# Patient Record
Sex: Male | Born: 1985
Health system: Southern US, Community
[De-identification: ages and names within clinical notes are randomized; demographics above are authoritative.]

## PROBLEM LIST (undated history)

## (undated) DIAGNOSIS — Z9889 Other specified postprocedural states: Secondary | ICD-10-CM

## (undated) DIAGNOSIS — Z8619 Personal history of other infectious and parasitic diseases: Secondary | ICD-10-CM

## (undated) DIAGNOSIS — R112 Nausea with vomiting, unspecified: Secondary | ICD-10-CM

## (undated) HISTORY — PX: SHOULDER SURGERY: SHX246

## (undated) HISTORY — PX: KNEE ARTHROSCOPY W/ ACL RECONSTRUCTION AND PATELLA GRAFT: SHX1861

## (undated) HISTORY — PX: ANKLE SURGERY: SHX546

## (undated) HISTORY — PX: BACK SURGERY: SHX140

## (undated) HISTORY — DX: Personal history of other infectious and parasitic diseases: Z86.19

---
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
## 2006-03-03 ENCOUNTER — Emergency Department (HOSPITAL_COMMUNITY): Admission: EM | Admit: 2006-03-03 | Discharge: 2006-03-03 | Payer: Self-pay | Admitting: Family Medicine

## 2006-11-12 ENCOUNTER — Emergency Department (HOSPITAL_COMMUNITY): Admission: EM | Admit: 2006-11-12 | Discharge: 2006-11-12 | Payer: Self-pay | Admitting: Emergency Medicine

## 2007-08-24 ENCOUNTER — Emergency Department (HOSPITAL_COMMUNITY): Admission: EM | Admit: 2007-08-24 | Discharge: 2007-08-24 | Payer: Self-pay | Admitting: Emergency Medicine

## 2007-08-31 ENCOUNTER — Ambulatory Visit (HOSPITAL_BASED_OUTPATIENT_CLINIC_OR_DEPARTMENT_OTHER): Admission: RE | Admit: 2007-08-31 | Discharge: 2007-08-31 | Payer: Self-pay | Admitting: Orthopedic Surgery

## 2008-01-05 ENCOUNTER — Emergency Department (HOSPITAL_COMMUNITY): Admission: EM | Admit: 2008-01-05 | Discharge: 2008-01-05 | Payer: Self-pay | Admitting: Family Medicine

## 2009-01-15 ENCOUNTER — Emergency Department (HOSPITAL_COMMUNITY): Admission: EM | Admit: 2009-01-15 | Discharge: 2009-01-15 | Payer: Self-pay | Admitting: Family Medicine

## 2009-05-23 ENCOUNTER — Emergency Department (HOSPITAL_COMMUNITY): Admission: EM | Admit: 2009-05-23 | Discharge: 2009-05-23 | Payer: Self-pay | Admitting: Family Medicine

## 2010-01-18 ENCOUNTER — Ambulatory Visit: Payer: Self-pay | Admitting: Internal Medicine

## 2010-07-07 ENCOUNTER — Emergency Department (HOSPITAL_COMMUNITY)
Admission: EM | Admit: 2010-07-07 | Discharge: 2010-07-07 | Payer: Self-pay | Source: Home / Self Care | Admitting: Emergency Medicine

## 2010-11-06 NOTE — Op Note (Signed)
[name], [name]            ACCOUNT NO.:  [id number]   MEDICAL RECORD NO.:  [id number]          PATIENT TYPE:  AMB   LOCATION:  DSC                          FACILITY:  [facility]   PHYSICIAN:  Eulas Post, MD    DATE OF BIRTH:  July 27, 1985   DATE OF PROCEDURE:  08/31/2007  DATE OF DISCHARGE:                               OPERATIVE REPORT   PREOPERATIVE DIAGNOSIS:  Left distal fibula / ankle fracture.   POSTOPERATIVE DIAGNOSIS:  Left distal fibula / ankle fracture.   OPERATIVE PROCEDURE:  Open reduction and internal fixation of left  distal fibula ankle fracture.   ANESTHESIA:  General.   TOURNIQUET TIME:  79 minutes.   ESTIMATED BLOOD LOSS:  Minimal.   ANTIBIOTICS:  1 g intravenous Ancef given preoperatively.   OPERATIVE IMPLANTS:  Synthes one-third tubular plate with a size 6 hole,  with a total of three cortical screws proximally and two cancellous  screws distally with a single lag screw across the fracture site.   PREOPERATIVE INDICATIONS:  Mr. [name] is a 25 year old young  man who was sledding when he fell and twisted his ankle.  He had a  fractured ankle that was unstable.  He elected to undergo the above-  named procedure.  The risks, benefits and alternatives to operative  intervention were discussed with him preoperatively including but not  limited to the risks of infection, bleeding, nerve injury, malunion,  nonunion, ankle stiffness, progression of arthritis, cardiopulmonary  complications, among others, and hardware prominence and hardware  failure and the need for hardware removal, and the patient was willing  to proceed.   OPERATIVE FINDINGS:  There was a very low fibula fracture with an  unstable mortise.  The fracture was well below the tibiotalar joint.  We  were able to get two solid cancellous screws distally and an excellent  lag screw.   OPERATIVE PROCEDURE:  The patient brought to the operating room and  placed in supine position.   General anesthesia was administered.  1 g  intravenous Ancef was given.  The left lower extremity was prepped and  draped in the usual sterile fashion.  Incision was made over the left  fibula.  The fibula was exposed and the fracture identified and reduced  and held with a clamp.  We placed the lag screw.  We then contoured the  plate appropriately and placed the plate as distal as possible in order  to achieve fixation for a very distal fracture.  Excellent fixation was  achieved.  Confirmation of the hardware was made on the AP and lateral  views with the fluoroscopy.  The wounds were irrigated copiously and the  deep tissue closed with 2-0 followed by 3-0 Vicryl and then a Monocryl  for the skin.  Steri-Strips were placed.  Sterile gauze and a posterior splint were  applied.  The patient was awakened and returned the PACU in stable and  satisfactory condition.  There were no complications.  The patient  tolerated the procedure well.      Eulas Post, MD  Electronically Signed  JPL/MEDQ  D:  08/31/2007  T:  09/01/2007  Job:  [id number]

## 2010-11-09 NOTE — Op Note (Signed)
NAME:  [name], [name]                      ACCOUNT NO.:  [id number]   MEDICAL RECORD NO.:  [id number]                   PATIENT TYPE:  OIB   LOCATION:  3033                                 FACILITY:  [facility]   PHYSICIAN:  Sharolyn Douglas, M.D.                     DATE OF BIRTH:  February 25, 1986   DATE OF PROCEDURE:  02/02/2004  DATE OF DISCHARGE:                                 OPERATIVE REPORT   PREOPERATIVE DIAGNOSIS:  L5-S1 isthmic spondylolisthesis.   OPERATION/PROCEDURE:  1. L5-S1 Gill laminectomy with bilateral foraminotomies.  2. Posterior spinal arthrodesis, L5-S1.  3. Pedicle screw instrumentation L5-S1 using the [employer] System.  4. Right posterior iliac crest bone graft supplement with local autogenous     bone graft.  5. Neuro monitoring with frequent EMGs x3 __________  testing of four     pedicle screws.   SURGEON:  Sharolyn Douglas, M.D.   ASSISTANT:  Jill Side Mahar, P.A.C.   ANESTHESIA:  General endotracheal anesthesia.   INDICATIONS:  The patient is a very pleasant 25 year old male with chronic  disabling back pain thought to be secondary to a grade 1/2 isthmic  spondylolisthesis.  He has been refractory to conservative treatment options  and has elected to undergo fusion in hopes of improving his symptoms.  Risks, benefits and alternatives were reviewed.   DESCRIPTION OF PROCEDURE:  The patient was brought to and identified in the  holding area, and taken to the operating room.  Underwent general  endotracheal anesthesia without difficulty, given prophylactic IV  antibiotics, carefully turned prone on to the Wilson frame.  All bony  prominences were padded __________  at all times.  Back prepped and draped in the usual sterile fashion.   Incision was made in the midline, L4 down to S1.  Dissection was carried  sharply through the deep fascia.  The L5 spinous process and lamina could be  easily identified with motion at the pars defect.  The transverse processes  of  L5 as well as the sacral ala were exposed bilaterally.  Deep retractors  were placed.  The Gill fragment was removed in one complete piece using  Leksell rongeurs and curets.  The bone was cleaned of all soft tissue and  morselized for later auto graft.  We then completed bilaterally  foraminotomies using Kerrison punches.  We had a good decompression of the  L5 nerve roots bilaterally.  We then turned our attention to placing pedicle  screws at L5-S1 bilaterally using anatomic probing technique and  fluoroscopy.  We placed 6.5 x 50 mm screws at L5 and 7.5 x 40 mm screws in  the sacrum.  We had good screw purchase.  After placing each screw,  triggered EMGs were utilized and there were no dilatory changes.  Free  running EMGs were monitored throughout the procedure and there were no  changes.   We then turned our attention to  completing the posterior spinal arthrodesis.  High-speed bur used to decorticate the transverse process of L5 in the  sacral ala.   We then turned our attention to harvesting right posterior iliac crest bone  graft.  This again was elevate out over the PSIS.  Through a separate  fascial incision, the iliac crest was exposed.  Leksell rongeur used to  remove the PSIS.  Curets were used to remove copious amounts of cancellous  bone graft from between the inner tables of the crest.  The wound was  irrigated.  Gelfoam left in the defect.  Fascia closed with interrupted #1  Vicryl suture.   We then returned out attention to the posterior spinal arthrodesis.  The  iliac crest bone graft was packed tightly over the lateral gutters.  The  local bone graft from the Gill fragment when packed over top of the  autograft.  Short segment titanium rods were placed and gentle compression  applied.  We achieved a partial reduction of the deformity when the screws  were tightened.  The locking caps were sheared off.  Final AP and laterals  images saved.  Deep Hemovac drain left in  place.  Deep fascia closed with a  running #1 Vicryl suture, subcutaneous layer closed with 2-0 Vicryl followed  by a running 3-0 subcuticular Vicryl suture, Benzoin and Steri-Strips.  Sterile dressing applied.  The patient was turned supine and extubated  without difficulty and transferred to recovery room in stable condition,  able to move his upper and lower extremities.                                               Sharolyn Douglas, M.D.    MC/MEDQ  D:  02/02/2004  T:  02/03/2004  Job:  [id number]

## 2010-11-09 NOTE — Discharge Summary (Signed)
[name], [name] NO.:  [id number]   MEDICAL RECORD NO.:  [id number]          PATIENT TYPE:  INP   LOCATION:  3033                         FACILITY:  [facility]   PHYSICIAN:  Sharolyn Douglas, M.D.        DATE OF BIRTH:  Feb 10, 1986   DATE OF ADMISSION:  02/02/2004  DATE OF DISCHARGE:  02/05/2004                                 DISCHARGE SUMMARY   ADMISSION DIAGNOSIS:  Spondylolisthesis lumbar verterbrae-5/sacral vertebrae-  1.   DISCHARGE DIAGNOSES:  1.  Status posterior spinal fusion lumbar verterbrae-5/sacral vertebrae-1      and Gill laminectomy.  2.  Hyperglycemia postoperatively.   PROCEDURE:  February 02, 2004, the patient was taken to the operating room for  L5-S1 posterior spinal fusion with pedicle screws and Gill laminectomies by  Dr. Sharolyn Douglas and assistant Verlin Fester, P.A.C.   ANESTHESIA:  General.   CONSULTATIONS:  None.   LABORATORY DATA:  Preoperative CBC with differential was within normal  limits.  PT, INR, and PTT within normal limits.  Complete metabolic panel  within normal limits.  UA within normal limits.  Postoperatively, H&H was  monitored and remained normal.  BMET was monitored one day and he did have  glucose elevated at 120.  Blood type from January 31, 2004 showed B positive,  antibody screen negative.   BRIEF HISTORY:  The patient is an 25 year old male with severe back pain  that is limiting his activities and his quality of life.  He has been seen  by Dr. Sharolyn Douglas and has tried numerous types of conservative management  including time, rest, activity, modification, physical therapy, medications.  Unfortunately, the pain continues to interfere with his quality of life and  was getting progressively worse despite treatment secondary to failure to  improve with conservative management as well as spondylolisthesis at L5/S1.  It was thought that his only course of management or hope to improve his  symptoms would be a posterior spinal  fusion as well as a Gill laminectomy.   The risks and benefits of this procedure were discussed at length with the  patient as well as his mother by Dr. Sharolyn Douglas and myself.  They indicated  understanding and wished to proceed.   HOSPITAL COURSE:  On February 02, 2004, the patient was taken to the operating  room for the above-listed procedure.  He tolerated the procedure well  without any intraoperative complication.  There was one Hemovac drain placed  intraoperatively.  He was transferred to the recovery room in stable  condition.   Postoperatively, routine orthopedic spine protocol was followed including  prophylactic antibiotics, early mobilization.  Diet was held and NPO until  he passed flatus.  He progressed well.  He did have family member get him  some food the night of surgery and  the following morning he was feeling a  bit uncomfortable, nauseated, and vomiting.  We reinforced to him that we  are keeping his diet at limited for his own well being and safety.  He did  stop eating on his own and advanced his diet after he  was told he could.   By February 04, 2004, all of his nausea and vomiting had resolved.  We slowly  advanced his diet to a regular diet.  He worked out daily with physical  therapy and made excellent progress with them.  By February 05, 2004, the  patient was eating a regular diet, tolerating it well, and passing flatus.  Medically, he was stable and ready for discharge.  Orthopedically, he had  met all goals.  He was doing extremely well.   PLAN:  The patient is an 25 year old male who is status post L5/S1 posterior  spinal fusion and Gill laminectomy doing well.   ACTIVITY:  Daily ambulation, brace on when he is up.  Back precautions.  No  lifting greater than five pounds.  Dressing changes daily.  His incision dry  x 5 days.   FOLLOW UP:  Follow up in two weeks postoperatively by Dr. Sharolyn Douglas.   DIET:  Regular home diet.   CONDITION ON DISCHARGE:   Stable and improved.   DISCHARGE MEDICATIONS:  1.  Percocet for pain.  2.  Robaxin for muscle.  3.  Multivitamin q.d.  4.  Laxative q.d.  5.  Calcium q.d.  6.  Avoid NSAIDs x 3 months.   CONDITION ON DISCHARGE:  Stable and improve.   DISPOSITION:  The patient is being discharged to his home with his family  assistance.       CM/MEDQ  D:  03/14/2004  T:  03/14/2004  Job:  [id number]

## 2011-02-22 ENCOUNTER — Ambulatory Visit (HOSPITAL_BASED_OUTPATIENT_CLINIC_OR_DEPARTMENT_OTHER)
Admission: RE | Admit: 2011-02-22 | Discharge: 2011-02-22 | Disposition: A | Discharge status: Home / Self Care | Payer: BC Managed Care – PPO | Source: Ambulatory Visit | Attending: Orthopedic Surgery | Admitting: Orthopedic Surgery

## 2011-02-22 DIAGNOSIS — S83289A Other tear of lateral meniscus, current injury, unspecified knee, initial encounter: Secondary | ICD-10-CM | POA: Insufficient documentation

## 2011-02-22 DIAGNOSIS — S83509A Sprain of unspecified cruciate ligament of unspecified knee, initial encounter: Secondary | ICD-10-CM | POA: Insufficient documentation

## 2011-02-22 DIAGNOSIS — Z01812 Encounter for preprocedural laboratory examination: Secondary | ICD-10-CM | POA: Insufficient documentation

## 2011-02-22 DIAGNOSIS — X58XXXA Exposure to other specified factors, initial encounter: Secondary | ICD-10-CM | POA: Insufficient documentation

## 2011-02-22 DIAGNOSIS — F17200 Nicotine dependence, unspecified, uncomplicated: Secondary | ICD-10-CM | POA: Insufficient documentation

## 2011-02-24 IMAGING — CR DG WRIST COMPLETE 3+V*R*
2 series · 2 of 2 positions shown · non-contrast
Comparison: None

CLINICAL DATA: Wrist pain after splitting Proano

RIGHT WRIST - COMPLETE 3+ VIEW

[view not recorded (1 of 2)]
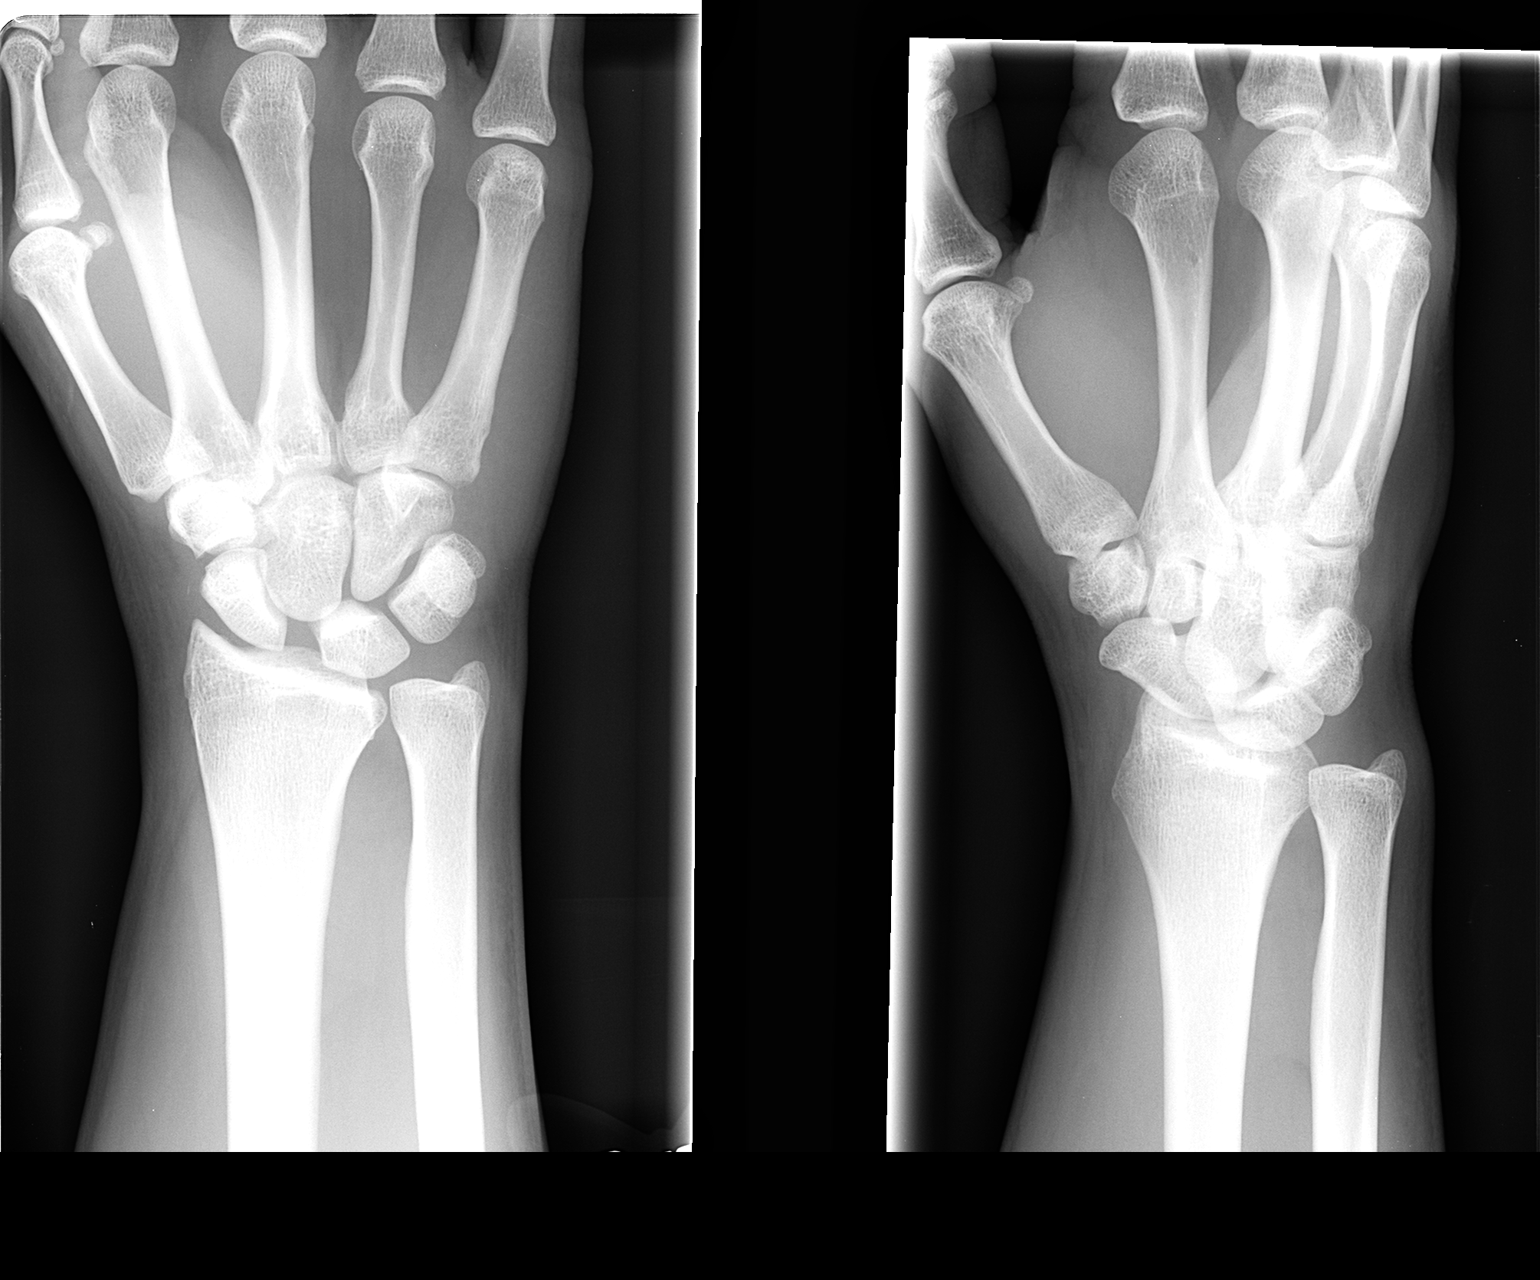

[view not recorded (2 of 2)]
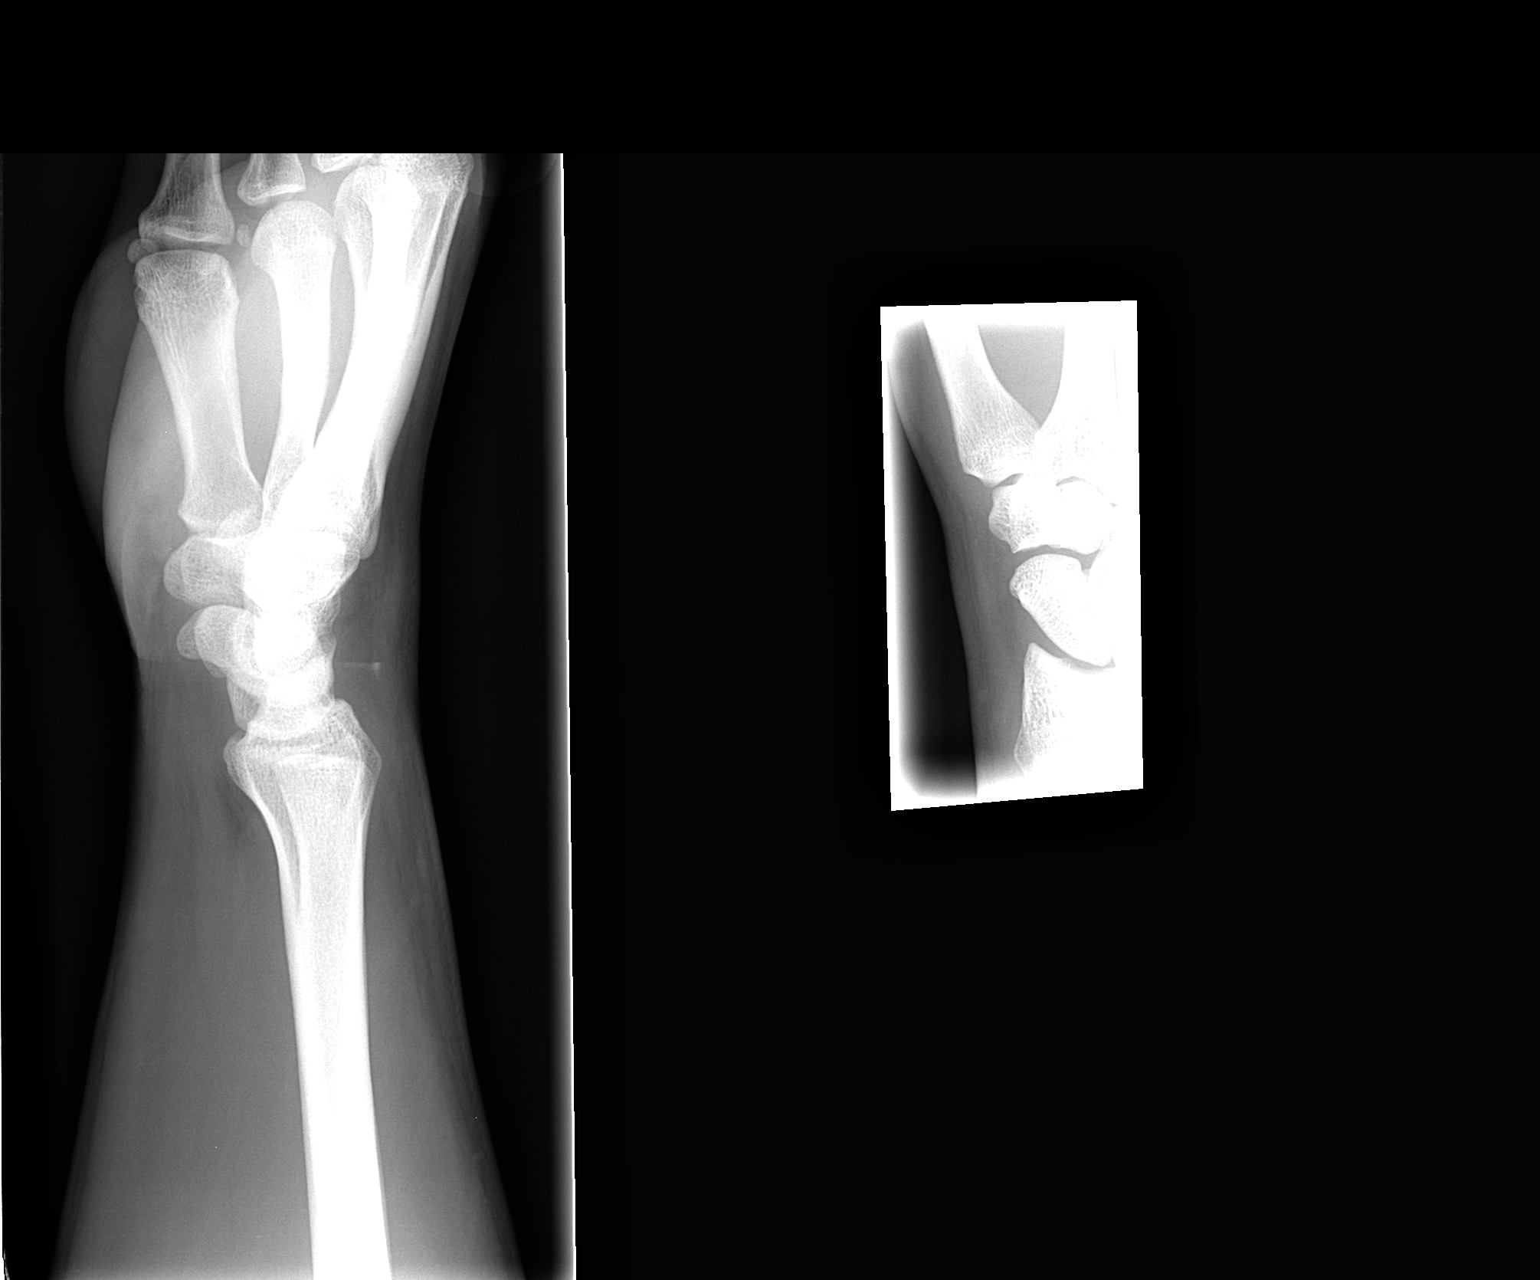

[2 of 2 positions shown; findings below may reference images not displayed]

FINDINGS: Normal alignment and no fracture.  No significant
degenerative change.

Faint density dorsal to the wrist joint is probably artifact on the
lateral view.
IMPRESSION: Negative for fracture.

## 2011-03-01 NOTE — Op Note (Signed)
NAMEMarland Murillo  BREVYN, RING NO.:  [id number]  MEDICAL RECORD NO.:  [id number]  LOCATION:                                 FACILITY:  PHYSICIAN:  Eulas Post, MD         DATE OF BIRTH:  DATE OF PROCEDURE:  02/22/2011 DATE OF DISCHARGE:                              OPERATIVE REPORT   ATTENDING SURGEON:  Eulas Post, MD  FIRST ASSISTANT:  Janace Litten, orthopedic PA-C  PREOPERATIVE DIAGNOSIS:  Right acromioclavicular tear, possible medial meniscus tear, possible lateral meniscus tear.  POSTOPERATIVE DIAGNOSIS:  Right acromioclavicular tear and posterior horn lateral meniscus tear.  OPERATIVE PROCEDURE:  Right acromioclavicular reconstruction and partial lateral meniscectomy.  ANESTHESIA:  General.  ESTIMATED BLOOD LOSS:  Minimal.  TOURNIQUET TIME:  Two hours.  OPERATIVE IMPLANTS:  Hamstring autograft and also an Arthrex ACL TightRope button with a size 9 x 32-mm BioComposite interference screw for the tibia and a 4.5-mm PushLock for backup on the tibia.  PREOPERATIVE INDICATIONS:  Mr. [name] is a 25 year old young man who tore his right ACL and had an injury to his meniscus.  He elected for surgical management.  The risks, benefits, and alternatives were discussed before the procedure including but not limited to risks of infection, bleeding, nerve injury, recurrent ACL rupture, stiffness, loss of function, cardiopulmonary complications, among others and he is willing to proceed.  OPERATIVE FINDINGS:  The ACL was completely torn and had a positive pivot and a positive Lachman.  His posterior drawer was intact.  He had a negative dial test.  He had some grade 1 instability to MCL testing. His posterior horn medial meniscus was intact with no instability.  The lateral meniscus had a flap tear of the posterior horn that was flipped into the back of the intercondylar notch.  The meniscus itself was in good condition around the body and  anterior horn, although the attachment to the posterior horn was damaged, although not completely unstable.  There were still intact fibers that maintained the integrity of the meniscus, although probably half of its insertion into the posterior horn had been disrupted.  The PCL was intact and the chondral surfaces of the patellofemoral joint, as well as the medial and lateral compartments were intact.  OPERATIVE PROCEDURE:  The patient was brought to the operating room and placed in supine position.  IV antibiotics were given.  General anesthesia was administered.  The right lower extremity was prepped and draped in the usual sterile fashion.  Time-out was performed.  He had a positive pivot and Lachman's and so I harvested his hamstring tendons first.  An incision was made over the insertion of the hamstring tendons, and dissection was carried down.  The sartorius was reflected and then semitendinosus and gracilis were identified.  All soft tissue attachments were released and then I harvested the hamstrings.  This was an excellent harvest, and his hamstrings were remarkably large. Ultimately, the graft measured 8.5 mm in diameter with a doubled over, and we actually left the length that longer than usual, 210 mm, in order to accommodate for his fairly tall stature.  I then performed a diagnostic arthroscopy while Apolinar Junes  Juan Quam, orthopedic PA-C, prepared the graft.  The meniscal tear on the lateral side was identified and then shaved and removed with the arthroscopic shaver.  This was trimmed down to a stable rim and the flap portion was completely excised.  I then performed a moderate notchplasty and excised the remnant ACL.  I then drilled my femoral tunnel using the 8.5-mm retro reamer from outside-in into the appropriate position on the wall.  Excellent cortical rim was achieved.  I then passed my suture and then drilled my tibial tunnel.  This was drilled with the 8.5-mm  retro reamer with a guide set at 55 degrees.  I then opened the tibia at the cortex with an 8.0-mm acorn reamer and then dilated up from an 8 to an 8.5.  I cleaned all bone debris out of the knee and then completed the passage of my passing sutures through the tibia.  The graft had been tensioned and was ready and I passed the graft first by flipping the button under direct visualization looking from the medial portal up into the notch, and I confirmed the button flipped with C-arm.  I then delivered the graft and delivered approximately 25 mm of graft into the femoral tunnel and then cycled the knee.  It had excellent isometry.  There was a little bit more tightness in full extension, but the overall isometry was excellent.  Remarkably, despite the fact that we left the graft at a length of 210 mm, it was pulled all the way within the tibial tunnel, and I measured the depth which was about 10-mm inside the tibial tunnel.  The overall tibial tunnel length was almost 50 mm.  Therefore, I selected the longer screw and placed a guidewire up the tibial tunnel, tensioned the graft, applied a reverse Lachman, and then placed the interference screw. Excellent interference fit was achieved and the graft was stabilized and the Lachman restored to normal translation.  There was no graft impingement.  Given the fact that the hamstring graft, however, did not exit out of the tibial tunnel, I did back this up with a PushLock.  All 4 strands were passed through a 4.5-mm PushLock and then buried in the bone with appropriate tension.  The knee had full motion with no undue tension on the graft and had excellent stability.  The wounds were irrigated copiously and the sartorius repaired followed by subcutaneous Vicryl and then Monocryl for the portals and incision.  The sutures were cut proximally as well.  The patient was awakened and the tourniquet was released and he returned to PACU in stable and  satisfactory condition. Sterile gauze was also applied and he was also injected.  He tolerated this well.  There were no complications.  Janace Litten, orthopedic PA-C, was present and scrubbed throughout the case and critical for assistance with exposure as well as graft preparation, instrumentation, tensioning, and closure.    Eulas Post, MD    JPL/MEDQ  D:  02/22/2011  T:  02/22/2011  Job:  [id number]  Electronically Signed by Teryl Lucy MD on 03/01/2011 04:32:46 PM

## 2011-03-18 LAB — POCT HEMOGLOBIN-HEMACUE: Hemoglobin: 15.8

## 2011-08-15 ENCOUNTER — Encounter (HOSPITAL_COMMUNITY): Payer: Self-pay | Admitting: *Deleted

## 2011-08-15 ENCOUNTER — Emergency Department (HOSPITAL_COMMUNITY)
Admission: EM | Admit: 2011-08-15 | Discharge: 2011-08-16 | Disposition: A | Discharge status: Home / Self Care | Payer: 59 | Attending: Emergency Medicine | Admitting: Emergency Medicine

## 2011-08-15 ENCOUNTER — Other Ambulatory Visit: Payer: Self-pay

## 2011-08-15 DIAGNOSIS — R079 Chest pain, unspecified: Secondary | ICD-10-CM | POA: Insufficient documentation

## 2011-08-15 DIAGNOSIS — I498 Other specified cardiac arrhythmias: Secondary | ICD-10-CM | POA: Insufficient documentation

## 2011-08-15 DIAGNOSIS — K219 Gastro-esophageal reflux disease without esophagitis: Secondary | ICD-10-CM | POA: Insufficient documentation

## 2011-08-15 DIAGNOSIS — F17200 Nicotine dependence, unspecified, uncomplicated: Secondary | ICD-10-CM | POA: Insufficient documentation

## 2011-08-15 LAB — CBC
HCT: 43.2 % (ref 39.0–52.0)
Hemoglobin: 15.9 g/dL (ref 13.0–17.0)
Platelets: 176 10*3/uL (ref 150–400)
RBC: 4.79 MIL/uL (ref 4.22–5.81)
WBC: 6.6 10*3/uL (ref 4.0–10.5)

## 2011-08-15 LAB — PRO B NATRIURETIC PEPTIDE: Pro B Natriuretic peptide (BNP): 9.1 pg/mL (ref 0–125)

## 2011-08-15 LAB — TROPONIN I: Troponin I: <0.3 ng/mL (ref <0.30)

## 2011-08-15 LAB — BASIC METABOLIC PANEL
BUN: 19 mg/dL (ref 6–23)
Potassium: 3.9 mEq/L (ref 3.5–5.1)

## 2011-08-15 MED ORDER — NITROGLYCERIN 0.4 MG SL SUBL
0.4000 mg | SUBLINGUAL_TABLET | SUBLINGUAL | Status: DC | PRN
  Administered 2011-08-15 (×2): 0.4 mg via SUBLINGUAL
  Filled 2011-08-15: qty 75

## 2011-08-15 MED ORDER — ACETAMINOPHEN 500 MG PO TABS
1000.0000 mg | ORAL_TABLET | ORAL | Status: AC
  Administered 2011-08-15: 1000 mg via ORAL
  Filled 2011-08-15: qty 2

## 2011-08-15 MED ORDER — ASPIRIN 325 MG PO TABS
325.0000 mg | ORAL_TABLET | ORAL | Status: AC
  Administered 2011-08-15: 325 mg via ORAL
  Filled 2011-08-15: qty 1

## 2011-08-15 NOTE — ED Notes (Signed)
MD at bedside. Idol PA with patient

## 2011-08-15 NOTE — ED Notes (Signed)
Pt has been having CP with heartburn and sob and headache and jaw pain for a couple of months, it was worse today.  Pt states that the pain increases with activity

## 2011-08-16 NOTE — ED Provider Notes (Signed)
Medical screening examination/treatment/procedure(s) were performed by non-physician practitioner and as supervising physician I was immediately available for consultation/collaboration.  Flint Melter, MD 08/16/11 [phone]

## 2011-08-16 NOTE — ED Provider Notes (Signed)
Patient with chronic symptoms of GERD. He is stable in the ED department. Evaluation negative for cardiac or pulmonary abnormalities.   Have recommended over-the-counter PPI and antacids when necessary.  Medical screening examination/treatment/procedure(s) were conducted as a shared visit with non-physician practitioner(s) and myself.  I personally evaluated the patient during the encounter  Flint Melter, MD 08/16/11 [phone]

## 2011-08-16 NOTE — Discharge Instructions (Signed)
Gastroesophageal Reflux Disease, Adult Gastroesophageal reflux disease (GERD) happens when acid from your stomach flows up into the esophagus. When acid comes in contact with the esophagus, the acid causes soreness (inflammation) in the esophagus. Over time, GERD may create small holes (ulcers) in the lining of the esophagus. CAUSES   Increased body weight. This puts pressure on the stomach, making acid rise from the stomach into the esophagus.   Smoking. This increases acid production in the stomach.   Drinking alcohol. This causes decreased pressure in the lower esophageal sphincter (valve or ring of muscle between the esophagus and stomach), allowing acid from the stomach into the esophagus.   Late evening meals and a full stomach. This increases pressure and acid production in the stomach.   A malformed lower esophageal sphincter.  Sometimes, no cause is found. SYMPTOMS   Burning pain in the lower part of the mid-chest behind the breastbone and in the mid-stomach area. This may occur twice a week or more often.   Trouble swallowing.   Sore throat.   Dry cough.   Asthma-like symptoms including chest tightness, shortness of breath, or wheezing.  DIAGNOSIS  Your caregiver may be able to diagnose GERD based on your symptoms. In some cases, X-rays and other tests may be done to check for complications or to check the condition of your stomach and esophagus. TREATMENT  Your caregiver may recommend over-the-counter or prescription medicines to help decrease acid production. Ask your caregiver before starting or adding any new medicines.  HOME CARE INSTRUCTIONS   Change the factors that you can control. Ask your caregiver for guidance concerning weight loss, quitting smoking, and alcohol consumption.   Avoid foods and drinks that make your symptoms worse, such as:   Caffeine or alcoholic drinks.   Chocolate.   Peppermint or mint flavorings.   Garlic and onions.   Spicy foods.     Citrus fruits, such as oranges, lemons, or limes.   Tomato-based foods such as sauce, chili, salsa, and pizza.   Fried and fatty foods.   Avoid lying down for the 3 hours prior to your bedtime or prior to taking a nap.   Eat small, frequent meals instead of large meals.   Wear loose-fitting clothing. Do not wear anything tight around your waist that causes pressure on your stomach.   Raise the head of your bed 6 to 8 inches with wood blocks to help you sleep. Extra pillows will not help.   Only take over-the-counter or prescription medicines for pain, discomfort, or fever as directed by your caregiver.   Do not take aspirin, ibuprofen, or other nonsteroidal anti-inflammatory drugs (NSAIDs).  SEEK IMMEDIATE MEDICAL CARE IF:   You have pain in your arms, neck, jaw, teeth, or back.   Your pain increases or changes in intensity or duration.   You develop nausea, vomiting, or sweating (diaphoresis).   You develop shortness of breath, or you faint.   Your vomit is green, yellow, black, or looks like coffee grounds or blood.   Your stool is red, bloody, or black.  These symptoms could be signs of other problems, such as heart disease, gastric bleeding, or esophageal bleeding. MAKE SURE YOU:   Understand these instructions.   Will watch your condition.   Will get help right away if you are not doing well or get worse.  Document Released: 03/20/2005 Document Revised: 02/20/2011 Document Reviewed: 12/28/2010 [facility] Patient Information 2012 [place], [place].Gastroesophageal Reflux Disease, Adult Gastroesophageal reflux disease (GERD) happens when  acid from your stomach flows up into the esophagus. When acid comes in contact with the esophagus, the acid causes soreness (inflammation) in the esophagus. Over time, GERD may create small holes (ulcers) in the lining of the esophagus. CAUSES   Increased body weight. This puts pressure on the stomach, making acid rise from the stomach  into the esophagus.   Smoking. This increases acid production in the stomach.   Drinking alcohol. This causes decreased pressure in the lower esophageal sphincter (valve or ring of muscle between the esophagus and stomach), allowing acid from the stomach into the esophagus.   Late evening meals and a full stomach. This increases pressure and acid production in the stomach.   A malformed lower esophageal sphincter.  Sometimes, no cause is found. SYMPTOMS   Burning pain in the lower part of the mid-chest behind the breastbone and in the mid-stomach area. This may occur twice a week or more often.   Trouble swallowing.   Sore throat.   Dry cough.   Asthma-like symptoms including chest tightness, shortness of breath, or wheezing.  DIAGNOSIS  Your caregiver may be able to diagnose GERD based on your symptoms. In some cases, X-rays and other tests may be done to check for complications or to check the condition of your stomach and esophagus. TREATMENT  Your caregiver may recommend over-the-counter or prescription medicines to help decrease acid production. Ask your caregiver before starting or adding any new medicines.  HOME CARE INSTRUCTIONS   Change the factors that you can control. Ask your caregiver for guidance concerning weight loss, quitting smoking, and alcohol consumption.   Avoid foods and drinks that make your symptoms worse, such as:   Caffeine or alcoholic drinks.   Chocolate.   Peppermint or mint flavorings.   Garlic and onions.   Spicy foods.   Citrus fruits, such as oranges, lemons, or limes.   Tomato-based foods such as sauce, chili, salsa, and pizza.   Fried and fatty foods.   Avoid lying down for the 3 hours prior to your bedtime or prior to taking a nap.   Eat small, frequent meals instead of large meals.   Wear loose-fitting clothing. Do not wear anything tight around your waist that causes pressure on your stomach.   Raise the head of your bed 6 to  8 inches with wood blocks to help you sleep. Extra pillows will not help.   Only take over-the-counter or prescription medicines for pain, discomfort, or fever as directed by your caregiver.   Do not take aspirin, ibuprofen, or other nonsteroidal anti-inflammatory drugs (NSAIDs).  SEEK IMMEDIATE MEDICAL CARE IF:   You have pain in your arms, neck, jaw, teeth, or back.   Your pain increases or changes in intensity or duration.   You develop nausea, vomiting, or sweating (diaphoresis).   You develop shortness of breath, or you faint.   Your vomit is green, yellow, black, or looks like coffee grounds or blood.   Your stool is red, bloody, or black.  These symptoms could be signs of other problems, such as heart disease, gastric bleeding, or esophageal bleeding. MAKE SURE YOU:   Understand these instructions.   Will watch your condition.   Will get help right away if you are not doing well or get worse.  Document Released: 03/20/2005 Document Revised: 02/20/2011 Document Reviewed: 12/28/2010 [facility] Patient Information 2012 [place], [place].     You may try Prilosec (no prescription needed) for better control of your acid reflux.  You may also try medicines such as maalox or mylanta.  Your labs and ekg are normal tonight - there is no sign of any heart involvement with your symptoms.    RESOURCE GUIDE  Dental Problems  Patients with Medicaid: [facility] [id number]                                           [phone] Phone:  [phone]                                                  Phone:  [phone]  If unable to pay or uninsured, contact:  [facility] or [facility]. to become qualified for the adult dental clinic.  Chronic Pain Problems Contact [facility]  [phone] Patients need to be referred by their primary care doctor.  Insufficient Money for  Medicine Contact United Way:  call "211" or [facility] 403-723-2616.  No Primary Care Doctor Call [facility]  [phone] Other agencies that provide inexpensive medical care    [facility]  [phone]    [facility]  [phone]    [facility]  [phone]    [facility]  [phone]    [facility]anned [facility]  8175770532    [facility]  [phone]  Psychological Services [facility]  [phone] [facility]  [phone] [facility]   [phone] (emergency services [phone])  Substance Abuse Resources [facility] Drug Services  [phone] [facility] [phone] [facility] [place] [phone] Floydene Flock [phone] Residential & Outpatient Substance Abuse Program  [phone]  Abuse/Neglect [facility] Child Abuse Hotline 414-690-4572 [facility] Child Abuse Hotline 270-119-0626 (After Hours)  Emergency Shelter [facility] [phone]  [facility] [place] [phone] [facility] [phone]  MRSA Hotline #:   575-786-2501    [facility] Resources  [facility] [place]     [employer]                          [facility] Dept. [address] [place]                       [address]      [address]  [place]                                                Cristobal Goldmann Phone:  [phone]  Phone:  [phone]                 Phone:  [phone]  [place] [facility] Phone:  [phone]  [place] Child Abuse Hotline [phone]) 342-1394 [phone] (After Hours)   

## 2011-08-16 NOTE — ED Provider Notes (Signed)
History     CSN: [id number]  Arrival date & time 08/15/11  1945   First MD Initiated Contact with Patient 08/15/11 2217      Chief Complaint  Patient presents with  . Chest Pain    (Consider location/radiation/quality/duration/timing/severity/associated sxs/prior treatment) HPI Comments: Patient presents for evaluation of chest pain associated with pain in his left jaw which has been intermittently present for the past couple months and worse today.  He doesn't describe true shortness of breath, but his wife noted that he was taking increased sighing respirations this afternoon.  He states the pain can be worse with exertion, although denies having any significant exertion over the past 6 months since he is recovering from an orthopedic knee procedure.  He was standing in his kitchen this evening prior to arrival here when he developed another episode of pain.  He describes burning pain in his low were mid substernal area without radiation.  He does describe a history significant for acid reflux with regurgitation which is classically worse at night when he is supine, trying to fall asleep and usually gets better with a couple of Tums tablets.  The pain in his chest he has today is also burning like his acid reflux pain at night, but without regurgitation.  Patient is a 26 y.o. male presenting with chest pain. The history is provided by the patient.  Chest Pain The chest pain began more  than 1 month ago. Chest pain occurs frequently. The chest pain is unchanged. At its most intense, the pain is at 4/10. The pain is currently at 1/10. The severity of the pain is moderate. The quality of the pain is described as burning. Pertinent negatives for primary symptoms include no fever, no syncope, no shortness of breath, no abdominal pain, no nausea, no vomiting and no dizziness.  Pertinent negatives for associated symptoms include no numbness and no weakness. He tried antacids for the symptoms. Risk  factors include no known risk factors.  Pertinent negatives for past medical history include no diabetes, no hyperlipidemia and no hypertension. Past medical history comments: States had a screening cholesterol panel done 6 years ago and was normal.  Pertinent negatives for family medical history include: no early MI in family.     History reviewed. No pertinent past medical history.  History reviewed. No pertinent past surgical history.  No family history on file.  History  Substance Use Topics  . Smoking status: Current Everyday Smoker    Types: Cigarettes  . Smokeless tobacco: Not on file  . Alcohol Use: Yes      Review of Systems  Constitutional: Negative for fever.  HENT: Negative for congestion, sore throat and neck pain.   Eyes: Negative.   Respiratory: Negative for chest tightness and shortness of breath.   Cardiovascular: Positive for chest pain. Negative for syncope.  Gastrointestinal: Negative for nausea, vomiting and abdominal pain.  Genitourinary: Negative.   Musculoskeletal: Negative for joint swelling and arthralgias.  Skin: Negative.  Negative for rash and wound.  Neurological: Negative for dizziness, weakness, light-headedness, numbness and headaches.  Hematological: Negative.   Psychiatric/Behavioral: Negative.     Allergies  Review of patient's allergies indicates no known allergies.  Home Medications  No current outpatient prescriptions on file.  BP 126/77  Pulse 62  Temp(Src) 97.9 F (36.6 C) (Oral)  Resp 19  Ht 6\' 5"  (1.956 m)  Wt 250 lb (113.399 kg)  BMI 29.65 kg/m2  SpO2 95%  Physical Exam  Nursing note  and vitals reviewed. Constitutional: He is oriented to person, place, and time. He appears well-developed and well-nourished.  HENT:  Head: Normocephalic and atraumatic.  Eyes: Conjunctivae are normal.  Neck: Normal range of motion.  Cardiovascular: Normal rate, regular rhythm, normal heart sounds and intact distal pulses.  Exam  reveals no friction rub.   No murmur heard. Pulmonary/Chest: Effort normal and breath sounds normal. He has no wheezes. He has no rales. He exhibits no tenderness.  Abdominal: Soft. Bowel sounds are normal. There is no tenderness.  Musculoskeletal: Normal range of motion.  Neurological: He is alert and oriented to person, place, and time.  Skin: Skin is warm and dry.  Psychiatric: He has a normal mood and affect.    ED Course  Procedures (including critical care time)  Labs Reviewed  CBC - Abnormal; Notable for the following:    MCHC 36.8 (*)    All other components within normal limits  BASIC METABOLIC PANEL  PRO B NATRIURETIC PEPTIDE  TROPONIN I   No results found.   1. GERD (gastroesophageal reflux disease)       MDM  Symptoms most consistent with GERD.  Encouraged to start Prilosec OTC, Maalox or Mylanta which may help relieve his GERD symptoms better than homes.  Normal labs and EKG.  Given resource guide for assistance locating PCP.   Date: 08/15/2011  Rate: 75  Rhythm: sinus arrhythmia  QRS Axis: normal  Intervals: normal  ST/T Wave abnormalities: normal  Conduction Disutrbances:none  Narrative Interpretation:   Old EKG Reviewed: none available          Candis Musa, PA 08/16/11 [phone]

## 2013-02-15 ENCOUNTER — Emergency Department (HOSPITAL_COMMUNITY)
Admission: EM | Admit: 2013-02-15 | Discharge: 2013-02-15 | Disposition: A | Discharge status: Home / Self Care | Payer: 59 | Source: Home / Self Care | Attending: Family Medicine | Admitting: Family Medicine

## 2013-02-15 ENCOUNTER — Encounter (HOSPITAL_COMMUNITY): Payer: Self-pay | Admitting: Emergency Medicine

## 2013-02-15 DIAGNOSIS — L259 Unspecified contact dermatitis, unspecified cause: Secondary | ICD-10-CM

## 2013-02-15 MED ORDER — PREDNISONE 10 MG PO TABS: ORAL_TABLET | ORAL | Status: DC

## 2013-02-15 NOTE — ED Notes (Signed)
C/o rash on arms and chest x 1 wk. Pt denies any changes in soap or detergents and contact with any poision ivy. Pt has tried cortisone, benadryl, and calamine lotion with no relief. States "gradually getting worse"

## 2013-02-15 NOTE — ED Provider Notes (Signed)
  CSN: [id number]     Arrival date & time 02/15/13  1209 History     First MD Initiated Contact with Patient 02/15/13 1331     Chief Complaint  Patient presents with  . Rash    on arms and chest  x 1wk   (Consider location/radiation/quality/duration/timing/severity/associated sxs/prior Treatment) HPI Comments: Pt developed bumpy rash on forearms last week, and it has spread to upper arms, chest, cheeks and ears.  Is a little itchy but not severe. Tried benadryl x1 and didn't notice a difference, so stopped using it. Tried cortisone cream but it didn't help.   Patient is a 27 y.o. male presenting with allergic reaction. The history is provided by the patient.  Allergic Reaction Presenting symptoms: itching and rash   Presenting symptoms: no wheezing   Itching:    Severity:  Mild   Onset quality:  Gradual   Duration:  1 week   Timing:  Intermittent   Progression:  Unchanged Rash:    Location:  Arm, chest and face   Quality: itchiness     Severity:  Moderate   Onset quality:  Gradual   Duration:  1 week   Timing:  Constant   Progression:  Worsening Severity:  Moderate Prior allergic episodes:  No prior episodes Relieved by:  Nothing Worsened by:  Nothing tried Ineffective treatments:  Antihistamines and OTC ointments   History reviewed. No pertinent past medical history. Past Surgical History  Procedure Laterality Date  . Back surgery    . Shoulder surgery    . Ankle surgery     History reviewed. No pertinent family history. History  Substance Use Topics  . Smoking status: Never Smoker   . Smokeless tobacco: Not on file  . Alcohol Use: Yes    Review of Systems  Constitutional: Negative for fever and chills.  Respiratory: Negative for shortness of breath and wheezing.   Skin: Positive for itching and rash.    Allergies  Review of patient's allergies indicates no known allergies.  Home Medications   Current Outpatient Rx  Name  Route  Sig  Dispense  Refill   . predniSONE (DELTASONE) 10 MG tablet      60mg  po daily for 4 days, then 40mg  daily for 4 days, then 20mg  daily for 4 days, then 10mg  daily for 4 days   52 tablet   0    BP 135/87  Pulse 56  Temp(Src) 98 F (36.7 C) (Oral)  Resp 12  SpO2 98% Physical Exam  Constitutional: He appears well-developed and well-nourished. No distress.  Skin: Skin is warm and dry. Rash noted. Rash is papular. There is pallor.  Pink papular rash on BUE, chest, cheeks and ears. Appears c/w contact derm.     ED Course   Procedures (including critical care time)  Labs Reviewed - No data to display No results found. 1. Contact dermatitis     MDM  Pt cannot identify possible contact source; no new detergents, meds, foods, lotions, etc. Rx prednisone 60mg  for 4 days, 40mg  for 4 days, 20mg  for 4 days, 10mg  for 4 days. Recommended daily claritin for at least 2 weeks.   Cathlyn Parsons, NP 02/15/13 1355

## 2013-02-16 NOTE — ED Provider Notes (Signed)
Medical screening examination/treatment/procedure(s) were performed by non-physician practitioner and as supervising physician I was immediately available for consultation/collaboration.   [place]; MD  Sharin Grave, MD 02/16/13 1040

## 2014-03-17 ENCOUNTER — Ambulatory Visit (INDEPENDENT_AMBULATORY_CARE_PROVIDER_SITE_OTHER): Payer: 59 | Admitting: Family Medicine

## 2014-03-17 VITALS — BP 106/80 | HR 52 | Temp 97.5°F | Resp 16 | Ht 75.25 in | Wt 245.2 lb

## 2014-03-17 DIAGNOSIS — L237 Allergic contact dermatitis due to plants, except food: Secondary | ICD-10-CM

## 2014-03-17 DIAGNOSIS — Z23 Encounter for immunization: Secondary | ICD-10-CM

## 2014-03-17 DIAGNOSIS — L255 Unspecified contact dermatitis due to plants, except food: Secondary | ICD-10-CM

## 2014-03-17 MED ORDER — TRIAMCINOLONE ACETONIDE 0.1 % EX CREA: 1.0000 "application " | TOPICAL_CREAM | Freq: Two times a day (BID) | CUTANEOUS | Status: DC

## 2014-03-17 MED ORDER — PREDNISONE 20 MG PO TABS: ORAL_TABLET | ORAL | Status: DC

## 2014-03-17 MED ORDER — METHYLPREDNISOLONE ACETATE 80 MG/ML IJ SUSP
120.0000 mg | Freq: Once | INTRAMUSCULAR | Status: AC
  Administered 2014-03-17: 120 mg via INTRAMUSCULAR

## 2014-03-17 NOTE — Progress Notes (Signed)
Subjective: 28 year old man who has been doing some yard work. He works over the weekend and then broke out with contact dermatitis on his arms and abdomen. He itches a lot.  He would like his flu shot today  Objective: No acute distress. Contact dermatitis mildly on his face. Him fairly extensively on the forearms and especially on the right side of the abdomen. Areas on the arms have linear streaking.  Assessment: Poison ivy contact dermatitis Flu shot  Plan: Depo-Medrol 120 IM Flu shot Prednisone Zyrtec Triamcinolone cream

## 2014-03-17 NOTE — Patient Instructions (Signed)
Wash well  Take the prednisone beginning on Sunday after breakfast 2 tablets daily for 3 days then one daily for 3 days  Take a Zyrtec over-the-counter once or twice daily for about 3 days, then once daily for itching if needed  Use the triamcinolone cream twice daily on rash, but they do not use more than 3 or 4 days on the face  Return if further problems

## 2014-09-25 ENCOUNTER — Ambulatory Visit (HOSPITAL_BASED_OUTPATIENT_CLINIC_OR_DEPARTMENT_OTHER): Payer: 59 | Attending: Family Medicine

## 2014-09-25 VITALS — Ht 76.0 in | Wt 237.0 lb

## 2014-09-25 DIAGNOSIS — G4710 Hypersomnia, unspecified: Secondary | ICD-10-CM | POA: Insufficient documentation

## 2014-09-25 DIAGNOSIS — R0683 Snoring: Secondary | ICD-10-CM | POA: Diagnosis not present

## 2014-10-01 DIAGNOSIS — R0683 Snoring: Secondary | ICD-10-CM | POA: Diagnosis not present

## 2014-10-01 NOTE — Sleep Study (Signed)
   NAME: Juan HartJonathan W Rhines DATE OF BIRTH:  1985/07/19 MEDICAL RECORD NUMBER [id number]  LOCATION: [facility]  PHYSICIAN: Clothilde Tippetts D  DATE OF STUDY: 09/25/2014  SLEEP STUDY TYPE: Nocturnal Polysomnogram               REFERRING PHYSICIAN: Farris HasMorrow, Aaron, MD  INDICATION FOR STUDY: Hypersomnia with sleep apnea  EPWORTH SLEEPINESS SCORE:   13/24 HEIGHT: 6\' 4"  (193 cm)  WEIGHT: 237 lb (107.502 kg)    Body mass index is 28.86 kg/(m^2).  NECK SIZE: 16.5 in.  MEDICATIONS: Charted for review  SLEEP ARCHITECTURE: Total sleep time 312.5 minutes with sleep efficiency 84.7%. Stage I was 9.8%, stage II 80%, stage III 0.5%, REM 9.8% of total sleep time. Sleep latency 24 minutes, REM latency 84 minutes, awake after sleep onset 32.5 minutes, arousal index 7.9, bedtime medication: None  RESPIRATORY DATA: Apnea hypopnea index (AHI) 1.0 per hour. 5 total events scored, all as nonsupine hypopneas. REM AHI 0. Split CPAP titration was not done.  OXYGEN DATA: Mild snoring with oxygen desaturation to a nadir of 85% and mean saturation 91.7% on room air  CARDIAC DATA: Normal sinus rhythm  MOVEMENT/PARASOMNIA: No significant movement disturbance, eye from 1  IMPRESSION/ RECOMMENDATION:   1) Occasional respiratory event with sleep disturbance, within normal limits. AHI 1.0 per hour. The normal range for adults is an AHI from 0-5 events per hour. Mild snoring with oxygen desaturation to a nadir of 85% and mean saturation 91.7% on room air. 2) The patient complained of loud snoring in the home environment . Snoring can be worse with nasal congestion, sleeping on back, and if extremely tired.   Waymon BudgeYOUNG,Zvi Duplantis D Diplomate, American Board of Sleep Medicine  ELECTRONICALLY SIGNED ON:  10/01/2014, 9:43 AM  [facility] PH: [phone]   FX: [phone] ACCREDITED BY THE AMERICAN ACADEMY OF SLEEP MEDICINE

## 2015-04-11 ENCOUNTER — Ambulatory Visit: Payer: Self-pay | Admitting: General Surgery

## 2015-04-19 ENCOUNTER — Encounter (HOSPITAL_COMMUNITY): Payer: Self-pay

## 2015-04-19 ENCOUNTER — Encounter (HOSPITAL_COMMUNITY)
Admission: RE | Admit: 2015-04-19 | Discharge: 2015-04-19 | Disposition: A | Discharge status: Home / Self Care | Payer: 59 | Source: Ambulatory Visit | Attending: General Surgery | Admitting: General Surgery

## 2015-04-19 DIAGNOSIS — K4030 Unilateral inguinal hernia, with obstruction, without gangrene, not specified as recurrent: Secondary | ICD-10-CM | POA: Diagnosis present

## 2015-04-19 DIAGNOSIS — Z87891 Personal history of nicotine dependence: Secondary | ICD-10-CM | POA: Diagnosis not present

## 2015-04-19 HISTORY — DX: Other specified postprocedural states: R11.2

## 2015-04-19 HISTORY — DX: Other specified postprocedural states: Z98.890

## 2015-04-19 LAB — CBC
HEMATOCRIT: 45.4 % (ref 39.0–52.0)
HEMOGLOBIN: 15.9 g/dL (ref 13.0–17.0)
MCH: 31.2 pg (ref 26.0–34.0)
MCHC: 35 g/dL (ref 30.0–36.0)
MCV: 89.2 fL (ref 78.0–100.0)
Platelets: 157 10*3/uL (ref 150–400)
RBC: 5.09 MIL/uL (ref 4.22–5.81)
RDW: 12.3 % (ref 11.5–15.5)
WBC: 6.7 10*3/uL (ref 4.0–10.5)

## 2015-04-19 NOTE — Pre-Procedure Instructions (Signed)
Valrie HartJonathan W Bartus  04/19/2015      [facility] [place] - [address] [place] [address] Phone: [phone] Fax: [phone]  [facility] [phone]#[id number] [place]- [place] - [address] [place] [address] Phone: [phone] Fax: [phone]    Your procedure is scheduled on   Friday   04/21/15  [facility] at [facility].M.  Call this number if you have problems the morning of surgery:  [phone]   Remember:  Do not eat food or drink liquids after midnight.  Take these medicines the morning of surgery with A SIP OF WATER   NONE    Do not wear jewelry, make-up or nail polish.  Do not wear lotions, powders, or perfumes.  You may wear deodorant.  Do not shave 48 hours prior to surgery.  Men may shave face and neck.  Do not bring valuables to the hospital.  [facility] is not responsible for any belongings or valuables.  Contacts, dentures or bridgework may not be worn into surgery.  Leave your suitcase in the car.  After surgery it may be brought to your room.  For patients admitted to the hospital, discharge time will be determined by your treatment team.  Patients discharged the day of surgery will not be allowed to drive home.   Name and phone number of your driver:      [facility]   Special instructions:  [place] - Preparing for Surgery  Before surgery, you can play an important role.  Because skin is not sterile, your skin needs to be as free of germs as possible.  You can reduce the number of germs on you skin by washing with CHG (chlorahexidine gluconate) soap before surgery.  CHG is an antiseptic cleaner which kills germs and bonds with the skin to continue killing germs even after washing.  Please DO NOT use if you have an allergy to CHG or antibacterial soaps.  If your skin becomes reddened/irritated stop using the CHG and inform your nurse  when you arrive at Short Stay.  Do not shave (including legs and underarms) for at least 48 hours prior to the first CHG shower.  You may shave your face.  Please follow these instructions carefully:   1.  Shower with CHG Soap the night before surgery and the                                morning of Surgery.  2.  If you choose to wash your hair, wash your hair first as usual with your       normal shampoo.  3.  After you shampoo, rinse your hair and body thoroughly to remove the                      Shampoo.  4.  Use CHG as you would any other liquid soap.  You can apply chg directly       to the skin and wash gently with scrungie or a clean washcloth.  5.  Apply the CHG Soap to your body ONLY FROM THE NECK DOWN.        Do not use on open wounds or open sores.  Avoid contact with your eyes,       ears, mouth and genitals (private parts).  Wash  genitals (private parts)       with your normal soap.  6.  Wash thoroughly, paying special attention to the area where your surgery        will be performed.  7.  Thoroughly rinse your body with warm water from the neck down.  8.  DO NOT shower/wash with your normal soap after using and rinsing off       the CHG Soap.  9.  Pat yourself dry with a clean towel.            10.  Wear clean pajamas.            11.  Place clean sheets on your bed the night of your first shower and do not        sleep with pets.  Day of Surgery  Do not apply any lotions/deoderants the morning of surgery.  Please wear clean clothes to the hospital/surgery center.    Please read over the following fact sheets that you were given. Pain Booklet, Coughing and Deep Breathing and Surgical Site Infection Prevention

## 2015-04-20 MED ORDER — CEFAZOLIN SODIUM-DEXTROSE 2-3 GM-% IV SOLR
2.0000 g | INTRAVENOUS | Status: AC
  Administered 2015-04-21: 2 g via INTRAVENOUS
  Filled 2015-04-20: qty 50

## 2015-04-20 MED ORDER — CHLORHEXIDINE GLUCONATE 4 % EX LIQD: 1.0000 "application " | Freq: Once | CUTANEOUS | Status: DC

## 2015-04-21 ENCOUNTER — Encounter (HOSPITAL_COMMUNITY)
Admission: RE | Disposition: A | Discharge status: Home / Self Care | Payer: Self-pay | Source: Ambulatory Visit | Attending: General Surgery

## 2015-04-21 ENCOUNTER — Ambulatory Visit (HOSPITAL_COMMUNITY): Payer: 59 | Admitting: Anesthesiology

## 2015-04-21 ENCOUNTER — Ambulatory Visit (HOSPITAL_COMMUNITY)
Admission: RE | Admit: 2015-04-21 | Discharge: 2015-04-21 | Disposition: A | Discharge status: Home / Self Care | Payer: 59 | Source: Ambulatory Visit | Attending: General Surgery | Admitting: General Surgery

## 2015-04-21 DIAGNOSIS — K4030 Unilateral inguinal hernia, with obstruction, without gangrene, not specified as recurrent: Secondary | ICD-10-CM | POA: Insufficient documentation

## 2015-04-21 DIAGNOSIS — Z87891 Personal history of nicotine dependence: Secondary | ICD-10-CM | POA: Insufficient documentation

## 2015-04-21 HISTORY — PX: INGUINAL HERNIA REPAIR: SHX194

## 2015-04-21 HISTORY — PX: INSERTION OF MESH: SHX5868

## 2015-04-21 SURGERY — REPAIR, HERNIA, INGUINAL, LAPAROSCOPIC
Anesthesia: General | Site: Groin | Laterality: Left

## 2015-04-21 MED ORDER — SODIUM CHLORIDE 0.9 % IV SOLN: 250.0000 mL | INTRAVENOUS | Status: DC | PRN

## 2015-04-21 MED ORDER — BUPIVACAINE HCL 0.25 % IJ SOLN
INTRAMUSCULAR | Status: DC | PRN
  Administered 2015-04-21: 30 mL

## 2015-04-21 MED ORDER — PHENYLEPHRINE HCL 10 MG/ML IJ SOLN
INTRAMUSCULAR | Status: AC
  Filled 2015-04-21: qty 1

## 2015-04-21 MED ORDER — PROMETHAZINE HCL 25 MG/ML IJ SOLN
INTRAMUSCULAR | Status: AC
  Filled 2015-04-21: qty 1

## 2015-04-21 MED ORDER — GLYCOPYRROLATE 0.2 MG/ML IJ SOLN
INTRAMUSCULAR | Status: AC
  Filled 2015-04-21: qty 1

## 2015-04-21 MED ORDER — FENTANYL CITRATE (PF) 250 MCG/5ML IJ SOLN
INTRAMUSCULAR | Status: AC
  Filled 2015-04-21: qty 5

## 2015-04-21 MED ORDER — PROMETHAZINE HCL 25 MG/ML IJ SOLN
6.2500 mg | INTRAMUSCULAR | Status: DC | PRN
Start: 2015-04-21 — End: 2015-04-21
  Administered 2015-04-21: 12.5 mg via INTRAVENOUS

## 2015-04-21 MED ORDER — MIDAZOLAM HCL 5 MG/5ML IJ SOLN
INTRAMUSCULAR | Status: DC | PRN
  Administered 2015-04-21: 2 mg via INTRAVENOUS

## 2015-04-21 MED ORDER — NEOSTIGMINE METHYLSULFATE 10 MG/10ML IV SOLN
INTRAVENOUS | Status: DC | PRN
  Administered 2015-04-21: 3 mg via INTRAVENOUS

## 2015-04-21 MED ORDER — OXYCODONE HCL 5 MG PO TABS: 5.0000 mg | ORAL_TABLET | ORAL | Status: DC | PRN

## 2015-04-21 MED ORDER — PROPOFOL 10 MG/ML IV BOLUS
INTRAVENOUS | Status: DC | PRN
  Administered 2015-04-21: 3 mg via INTRAVENOUS
  Administered 2015-04-21: 70 mg via INTRAVENOUS
  Administered 2015-04-21: 200 mg via INTRAVENOUS
  Administered 2015-04-21: 30 mg via INTRAVENOUS
  Administered 2015-04-21: 60 mg via INTRAVENOUS

## 2015-04-21 MED ORDER — 0.9 % SODIUM CHLORIDE (POUR BTL) OPTIME
TOPICAL | Status: DC | PRN
  Administered 2015-04-21: 1000 mL

## 2015-04-21 MED ORDER — ACETAMINOPHEN 650 MG RE SUPP: 650.0000 mg | RECTAL | Status: DC | PRN

## 2015-04-21 MED ORDER — OXYCODONE HCL 5 MG PO TABS: 5.0000 mg | ORAL_TABLET | Freq: Once | ORAL | Status: DC | PRN

## 2015-04-21 MED ORDER — GLYCOPYRROLATE 0.2 MG/ML IJ SOLN
INTRAMUSCULAR | Status: DC | PRN
  Administered 2015-04-21: 0.4 mg via INTRAVENOUS

## 2015-04-21 MED ORDER — ROCURONIUM BROMIDE 50 MG/5ML IV SOLN
INTRAVENOUS | Status: AC
  Filled 2015-04-21: qty 1

## 2015-04-21 MED ORDER — FENTANYL CITRATE (PF) 100 MCG/2ML IJ SOLN
INTRAMUSCULAR | Status: AC
  Filled 2015-04-21: qty 2

## 2015-04-21 MED ORDER — MIDAZOLAM HCL 2 MG/2ML IJ SOLN
INTRAMUSCULAR | Status: AC
  Filled 2015-04-21: qty 4

## 2015-04-21 MED ORDER — FENTANYL CITRATE (PF) 100 MCG/2ML IJ SOLN
25.0000 ug | INTRAMUSCULAR | Status: DC | PRN
  Administered 2015-04-21: 25 ug via INTRAVENOUS

## 2015-04-21 MED ORDER — LIDOCAINE HCL (CARDIAC) 20 MG/ML IV SOLN
INTRAVENOUS | Status: AC
  Filled 2015-04-21: qty 5

## 2015-04-21 MED ORDER — FENTANYL CITRATE (PF) 100 MCG/2ML IJ SOLN
INTRAMUSCULAR | Status: DC | PRN
  Administered 2015-04-21: 50 ug via INTRAVENOUS
  Administered 2015-04-21 (×2): 100 ug via INTRAVENOUS
  Administered 2015-04-21 (×2): 50 ug via INTRAVENOUS
  Administered 2015-04-21: 100 ug via INTRAVENOUS
  Administered 2015-04-21: 50 ug via INTRAVENOUS

## 2015-04-21 MED ORDER — ONDANSETRON HCL 4 MG/2ML IJ SOLN
INTRAMUSCULAR | Status: DC | PRN
  Administered 2015-04-21: 4 mg via INTRAVENOUS

## 2015-04-21 MED ORDER — LIDOCAINE HCL (CARDIAC) 20 MG/ML IV SOLN
INTRAVENOUS | Status: DC | PRN
  Administered 2015-04-21: 100 mg via INTRAVENOUS

## 2015-04-21 MED ORDER — PROPOFOL 10 MG/ML IV BOLUS
INTRAVENOUS | Status: AC
  Filled 2015-04-21: qty 20

## 2015-04-21 MED ORDER — SCOPOLAMINE 1 MG/3DAYS TD PT72
1.0000 | MEDICATED_PATCH | TRANSDERMAL | Status: DC
  Administered 2015-04-21: 1.5 mg via TRANSDERMAL
  Administered 2015-04-21: 1 via TRANSDERMAL
  Filled 2015-04-21: qty 1

## 2015-04-21 MED ORDER — BUPIVACAINE HCL (PF) 0.25 % IJ SOLN
INTRAMUSCULAR | Status: AC
  Filled 2015-04-21: qty 30

## 2015-04-21 MED ORDER — ACETAMINOPHEN 325 MG PO TABS: 650.0000 mg | ORAL_TABLET | ORAL | Status: DC | PRN

## 2015-04-21 MED ORDER — ONDANSETRON HCL 4 MG/2ML IJ SOLN
INTRAMUSCULAR | Status: AC
  Filled 2015-04-21: qty 2

## 2015-04-21 MED ORDER — SUGAMMADEX SODIUM 200 MG/2ML IV SOLN
INTRAVENOUS | Status: AC
  Filled 2015-04-21: qty 2

## 2015-04-21 MED ORDER — SODIUM CHLORIDE 0.9 % IJ SOLN: 3.0000 mL | Freq: Two times a day (BID) | INTRAMUSCULAR | Status: DC

## 2015-04-21 MED ORDER — EPHEDRINE SULFATE 50 MG/ML IJ SOLN
INTRAMUSCULAR | Status: AC
  Filled 2015-04-21: qty 1

## 2015-04-21 MED ORDER — SODIUM CHLORIDE 0.9 % IJ SOLN: 3.0000 mL | INTRAMUSCULAR | Status: DC | PRN

## 2015-04-21 MED ORDER — LACTATED RINGERS IV SOLN
INTRAVENOUS | Status: DC
  Administered 2015-04-21 (×3): via INTRAVENOUS

## 2015-04-21 MED ORDER — OXYCODONE HCL 5 MG/5ML PO SOLN: 5.0000 mg | Freq: Once | ORAL | Status: DC | PRN

## 2015-04-21 MED ORDER — OXYCODONE-ACETAMINOPHEN 5-325 MG PO TABS: 1.0000 | ORAL_TABLET | ORAL | Status: DC | PRN

## 2015-04-21 MED ORDER — ROCURONIUM BROMIDE 100 MG/10ML IV SOLN
INTRAVENOUS | Status: DC | PRN
  Administered 2015-04-21: 50 mg via INTRAVENOUS
  Administered 2015-04-21: 20 mg via INTRAVENOUS
  Administered 2015-04-21: 10 mg via INTRAVENOUS

## 2015-04-21 MED ORDER — SODIUM CHLORIDE 0.9 % IJ SOLN
INTRAMUSCULAR | Status: AC
  Filled 2015-04-21: qty 10

## 2015-04-21 MED ORDER — MORPHINE SULFATE (PF) 2 MG/ML IV SOLN: 2.0000 mg | INTRAVENOUS | Status: DC | PRN

## 2015-04-21 MED ORDER — PHENYLEPHRINE 40 MCG/ML (10ML) SYRINGE FOR IV PUSH (FOR BLOOD PRESSURE SUPPORT)
PREFILLED_SYRINGE | INTRAVENOUS | Status: AC
  Filled 2015-04-21: qty 10

## 2015-04-21 MED ORDER — ARTIFICIAL TEARS OP OINT
TOPICAL_OINTMENT | OPHTHALMIC | Status: AC
  Filled 2015-04-21: qty 3.5

## 2015-04-21 SURGICAL SUPPLY — 44 items
APPLIER CLIP 5 13 M/L LIGAMAX5 (MISCELLANEOUS)
BENZOIN TINCTURE PRP APPL 2/3 (GAUZE/BANDAGES/DRESSINGS) ×2 IMPLANT
CANISTER SUCTION 2500CC (MISCELLANEOUS) IMPLANT
CHLORAPREP W/TINT 26ML (MISCELLANEOUS) ×2 IMPLANT
CLIP APPLIE 5 13 M/L LIGAMAX5 (MISCELLANEOUS) IMPLANT
COVER SURGICAL LIGHT HANDLE (MISCELLANEOUS) ×2 IMPLANT
DISSECTOR BLUNT TIP ENDO 5MM (MISCELLANEOUS) IMPLANT
ELECT REM PT RETURN 9FT ADLT (ELECTROSURGICAL) ×2
ELECTRODE REM PT RTRN 9FT ADLT (ELECTROSURGICAL) ×1 IMPLANT
GAUZE SPONGE 2X2 8PLY STRL LF (GAUZE/BANDAGES/DRESSINGS) ×1 IMPLANT
GLOVE BIO SURGEON STRL SZ7 (GLOVE) ×2 IMPLANT
GLOVE BIO SURGEON STRL SZ7.5 (GLOVE) ×2 IMPLANT
GLOVE BIOGEL PI IND STRL 7.0 (GLOVE) ×1 IMPLANT
GLOVE BIOGEL PI IND STRL 7.5 (GLOVE) ×1 IMPLANT
GLOVE BIOGEL PI INDICATOR 7.0 (GLOVE) ×1
GLOVE BIOGEL PI INDICATOR 7.5 (GLOVE) ×1
GOWN STRL REUS W/ TWL LRG LVL3 (GOWN DISPOSABLE) ×2 IMPLANT
GOWN STRL REUS W/ TWL XL LVL3 (GOWN DISPOSABLE) ×1 IMPLANT
GOWN STRL REUS W/TWL LRG LVL3 (GOWN DISPOSABLE) ×2
GOWN STRL REUS W/TWL XL LVL3 (GOWN DISPOSABLE) ×1
KIT BASIN OR (CUSTOM PROCEDURE TRAY) ×2 IMPLANT
KIT ROOM TURNOVER OR (KITS) ×2 IMPLANT
MESH 3DMAX 4X6 LT LRG (Mesh General) ×2 IMPLANT
MESH 3DMAX LIGHT 4.8X6.7 LT XL (Mesh General) IMPLANT
NEEDLE INSUFFLATION 14GA 120MM (NEEDLE) IMPLANT
NS IRRIG 1000ML POUR BTL (IV SOLUTION) ×2 IMPLANT
PAD ARMBOARD 7.5X6 YLW CONV (MISCELLANEOUS) ×4 IMPLANT
RELOAD STAPLE HERNIA 4.0 BLUE (INSTRUMENTS) ×2 IMPLANT
RELOAD STAPLE HERNIA 4.8 BLK (STAPLE) IMPLANT
SCISSORS LAP 5X35 DISP (ENDOMECHANICALS) ×2 IMPLANT
SET IRRIG TUBING LAPAROSCOPIC (IRRIGATION / IRRIGATOR) IMPLANT
SET TROCAR LAP APPLE-HUNT 5MM (ENDOMECHANICALS) ×2 IMPLANT
SPONGE GAUZE 2X2 STER 10/PKG (GAUZE/BANDAGES/DRESSINGS) ×1
STAPLER HERNIA 12 8.5 360D (INSTRUMENTS) ×2 IMPLANT
STRIP CLOSURE SKIN 1/2X4 (GAUZE/BANDAGES/DRESSINGS) ×2 IMPLANT
SUT MNCRL AB 4-0 PS2 18 (SUTURE) ×2 IMPLANT
SUT VIC AB 1 CT1 27 (SUTURE)
SUT VIC AB 1 CT1 27XBRD ANBCTR (SUTURE) IMPLANT
TOWEL OR 17X24 6PK STRL BLUE (TOWEL DISPOSABLE) ×2 IMPLANT
TOWEL OR 17X26 10 PK STRL BLUE (TOWEL DISPOSABLE) ×2 IMPLANT
TRAY FOLEY CATH 16FR SILVER (SET/KITS/TRAYS/PACK) ×2 IMPLANT
TRAY LAPAROSCOPIC MC (CUSTOM PROCEDURE TRAY) ×2 IMPLANT
TROCAR XCEL 12X100 BLDLESS (ENDOMECHANICALS) ×2 IMPLANT
TUBING INSUFFLATION (TUBING) ×2 IMPLANT

## 2015-04-21 NOTE — Anesthesia Postprocedure Evaluation (Signed)
  Anesthesia Post-op Note  Patient: Juan HartJonathan W Asante  Procedure(s) Performed: Procedure(s) (LRB): LAPAROSCOPIC INGUINAL HERNIA (Left) INSERTION OF MESH (Left)  Patient Location: PACU  Anesthesia Type: General  Level of Consciousness: awake and alert   Airway and Oxygen Therapy: Patient Spontanous Breathing  Post-op Pain: mild  Post-op Assessment: Post-op Vital signs reviewed, Patient's Cardiovascular Status Stable, Respiratory Function Stable, Patent Airway and No signs of Nausea or vomiting  Last Vitals:  Filed Vitals:   04/21/15 1257  BP: 143/91  Pulse: 56  Temp:   Resp:     Post-op Vital Signs: stable   Complications: No apparent anesthesia complications

## 2015-04-21 NOTE — Anesthesia Procedure Notes (Signed)
Procedure Name: Intubation Date/Time: 04/21/2015 9:47 AM Performed by: Fransisca KaufmannMEYER, Casilda Pickerill E Pre-anesthesia Checklist: Patient identified, Emergency Drugs available, Suction available, Patient being monitored and Timeout performed Patient Re-evaluated:Patient Re-evaluated prior to inductionOxygen Delivery Method: Circle system utilized Preoxygenation: Pre-oxygenation with 100% oxygen Intubation Type: IV induction Ventilation: Mask ventilation without difficulty Laryngoscope Size: Mac and 4 Grade View: Grade I Tube type: Oral Tube size: 7.5 mm Number of attempts: 1 Airway Equipment and Method: Stylet Placement Confirmation: ETT inserted through vocal cords under direct vision,  positive ETCO2 and breath sounds checked- equal and bilateral Secured at: 23 cm Tube secured with: Tape Dental Injury: Teeth and Oropharynx as per pre-operative assessment

## 2015-04-21 NOTE — Discharge Instructions (Signed)
[facility]  I INGUINAL HERNIA REPAIR: POST OP INSTRUCTIONS  Always review your discharge instruction sheet given to you by the facility where your surgery was performed. IF YOU HAVE DISABILITY OR FAMILY LEAVE FORMS, YOU MUST BRING THEM TO THE OFFICE FOR PROCESSING.   DO NOT GIVE THEM TO YOUR DOCTOR.  1. A  prescription for pain medication may be given to you upon discharge.  Take your pain medication as prescribed, if needed.  If narcotic pain medicine is not needed, then you may take acetaminophen (Tylenol) or ibuprofen (Advil) as needed. 2. Take your usually prescribed medications unless otherwise directed. 3. If you need a refill on your pain medication, please contact your pharmacy.  They will contact our office to request authorization. Prescriptions will not be filled after 5 pm or on week-ends. 4. You should follow a light diet the first 24 hours after arrival home, such as soup and crackers, etc.  Be sure to include lots of fluids daily.  Resume your normal diet the day after surgery. 5. Most patients will experience some swelling and bruising around the umbilicus or in the groin and scrotum.  Ice packs and reclining will help.  Swelling and bruising can take several days to resolve.  6. It is common to experience some constipation if taking pain medication after surgery.  Increasing fluid intake and taking a stool softener (such as Colace) will usually help or prevent this problem from occurring.  A mild laxative (Milk of Magnesia or Miralax) should be taken according to package directions if there are no bowel movements after 48 hours. 7. Unless discharge instructions indicate otherwise, you may remove your bandages 24-48 hours after surgery, and you may shower at that time.  You may have steri-strips (small skin tapes) in place directly over the incision.  These strips should be left on the skin for 7-10 days.  If your surgeon used skin glue on the incision, you  may shower in 24 hours.  The glue will flake off over the next 2-3 weeks.  Any sutures or staples will be removed at the office during your follow-up visit. 8. ACTIVITIES:  You may resume regular (light) daily activities beginning the next day--such as daily self-care, walking, climbing stairs--gradually increasing activities as tolerated.  You may have sexual intercourse when it is comfortable.  Refrain from any heavy lifting or straining until approved by your doctor. a. You may drive when you are no longer taking prescription pain medication, you can comfortably wear a seatbelt, and you can safely maneuver your car and apply brakes. b. RETURN TO WORK:  __________________________________________________________ 9. You should see your doctor in the office for a follow-up appointment approximately 2-3 weeks after your surgery.  Make sure that you call for this appointment within a day or two after you arrive home to insure a convenient appointment time. 10. OTHER INSTRUCTIONS:  __________________________________________________________________________________________________________________________________________________________________________________________  WHEN TO CALL YOUR DOCTOR: 1. Fever over 101.0 2. Inability to urinate 3. Nausea and/or vomiting 4. Extreme swelling or bruising 5. Continued bleeding from incision. 6. Increased pain, redness, or drainage from the incision  The clinic staff is available to answer your questions during regular business hours.  Please dont hesitate to call and ask to speak to one of the nurses for clinical concerns.  If you have a medical emergency, go to the nearest emergency room or call 911.  A surgeon from [facility] is always on call at the hospital   [address], [address], [place], [place]  [address] ? ° [address] [address], [place], [place]   [address] °[phone] ? [phone] ? FAX [phone] °Web site:  [email or web address] ° °

## 2015-04-21 NOTE — Transfer of Care (Signed)
Immediate Anesthesia Transfer of Care Note  Patient: [name]  Procedure(s) Performed: Procedure(s): LAPAROSCOPIC INGUINAL HERNIA (Left) INSERTION OF MESH (Left)  Patient Location: PACU  Anesthesia Type:General  Level of Consciousness: awake, alert , oriented and sedated  Airway & Oxygen Therapy: Patient Spontanous Breathing and Patient connected to face mask oxygen  Post-op Assessment: Report given to RN, Post -op Vital signs reviewed and stable and Patient moving all extremities  Post vital signs: Reviewed and stable  Last Vitals:  Filed Vitals:   04/21/15 1122  BP: 166/97  Pulse: 72  Temp: 36.6 C  Resp: 15    Complications: No apparent anesthesia complications

## 2015-04-21 NOTE — Op Note (Signed)
04/21/2015  10:54 AM  PATIENT:  [name]  29 y.o. male  PRE-OPERATIVE DIAGNOSIS:  LEFT INCARCERATED INGUINAL HERNIA  POST-OPERATIVE DIAGNOSIS:  LEFT INCARCERATED INDIRECT INGUINAL HERNIA  PROCEDURE:  Procedure(s): LAPAROSCOPIC INGUINAL HERNIA (Left) INSERTION OF MESH (Left)  SURGEON:  Surgeon(s) and Role:    * Axel FillerArmando Violette Morneault, MD - Primary  PHYSICIAN ASSISTANT:   ASSISTANTS: none   ANESTHESIA:   local and general  EBL:  Total I/O In: 1000 [I.V.:1000] Out: -   BLOOD ADMINISTERED:none  DRAINS: none   LOCAL MEDICATIONS USED:  BUPIVICAINE   SPECIMEN:  No Specimen  DISPOSITION OF SPECIMEN:  n/a  COUNTS:  YES  TOURNIQUET:  * No tourniquets in log *  DICTATION: .Dragon Dictation   Counts: reported as correct x 2  Findings:  The patient had a large left incarcerated indirect hernia  Indications for procedure:  The patient is a 29 year old male with a left hernia for several months. Patient complained of symptomatology to his left inguinal area. The patient was taken back for elective inguinal hernia repair.  Details of the procedure: The patient was taken back to the operating room. The patient was placed in supine position with bilateral SCDs in place.  The patient was prepped and draped in the usual sterile fashion.  After appropriate anitbiotics were confirmed, a time-out was confirmed and all facts were verified.  0.25% Marcaine was used to infiltrate the umbilical area. A 11-blade was used to cut down the skin and blunt dissection was used to get the anterior fashion.  The anterior fascia was incised approximately 1 cm and the muscles were retracted laterally. Blunt dissection was then used to create a space in the preperitoneal area. At this time a 10 mm camera was then introduced into the space and advanced the pubic tubercle and a 12 mm trocar was placed over this and insufflation was started.  At this time and space was created from medial to laterally  the preperitoneal space.  Cooper's ligament was initially cleaned off.  The hernia sac was identified in the indirect space.  It was very scarred in. Dissection of the hernia sac was undertaken the vas deferens was identified and protected in all parts of the case.    Once the hernia sac was taken down to approximately the umbilicus a Bard 3D Max mesh, size: Large, was  introduced into the preperitoneal space.  The mesh was brought over to cover the direct and indirect hernia spaces.  This was anchored into place and secured to Cooper's ligament with 4.340mm staples from a Coviden hernia stapler. It was anchored to the anterior abdominal wall with 4.8 mm staples. The hernia sac was seen lying posterior to the mesh. There was no staples placed laterally. The insufflation was evacuated and the peritoneum was seen posterior to the mesh. The trochars were removed. The anterior fascia was reapproximated using #1 Vicryl on a UR- 6.  Intra-abdominal air was evacuated and the Veress needle removed. The skin was reapproximated using 4-0 Monocryl subcuticular fashion the patient was awakened from general anesthesia and taken to recovery in stable condition.   PLAN OF CARE: Discharge to home after PACU  PATIENT DISPOSITION:  PACU - hemodynamically stable.   Delay start of Pharmacological VTE agent (>24hrs) due to surgical blood loss or risk of bleeding: not applicable

## 2015-04-21 NOTE — H&P (Addendum)
History of Present Illness [name](Juan Nine MD; 04/03/2015 9:43 AM) Patient words: hernia.  The patient is a 29 year old male who presents with an inguinal hernia. The patient is a 29 year old male who is referred by Dr. Clifton CustardAaron Marrow for an evaluation of a left inguinal hernia. The patient states it's been there for approximately 2 months. He states his becoming more bothersome. He does state that he is able to reduce it while lying down. The patient works as a IT sales professionalfirefighter. He states he does not do very much heavy lifting at work. He states she's had no signs or symptoms of incarceration or strangulation.   Allergies (Sonya Bynum, CMA; 04/03/2015 9:15 AM) No Known Drug Allergies10/03/2015  Medication History (Sonya Bynum, CMA; 04/03/2015 9:15 AM) No Current Medications Medications Reconciled  Social History Gilmer Mor(Sonya Bynum, CMA; 04/03/2015 9:14 AM) Caffeine use Coffee. Tobacco use Former smoker.    Review of Systems [name](Keylee Shrestha MD; 04/03/2015 9:42 AM) General Present- Feeling well. Not Present- Fever. HEENT Not Present- Earache, Hearing Loss, Hoarseness, Nose Bleed, Oral Ulcers, Ringing in the Ears, Seasonal Allergies, Sinus Pain, Sore Throat, Visual Disturbances, Wears glasses/contact lenses and Yellow Eyes. Respiratory Not Present- Cough and Difficulty Breathing. Breast Not Present- Breast Mass, Breast Pain, Nipple Discharge and Skin Changes. Cardiovascular Not Present- Chest Pain. Gastrointestinal Not Present- Abdominal Pain, Bloating, Bloody Stool, Change in Bowel Habits, Chronic diarrhea, Constipation, Difficulty Swallowing, Excessive gas, Gets full quickly at meals, Hemorrhoids, Indigestion, Nausea, Rectal Pain and Vomiting. Musculoskeletal Not Present- Myalgia. Neurological Not Present- Weakness.    Physical Exam [name](Lilee Aldea MD; 04/03/2015 9:42 AM) General Mental Status-Alert. General Appearance-Consistent with stated age. Hydration-Well  hydrated. Voice-Normal.  Head and Neck Head-normocephalic, atraumatic with no lesions or palpable masses. Trachea-midline.  Eye Eyeball - Bilateral-Extraocular movements intact. Sclera/Conjunctiva - Bilateral-No scleral icterus.  Chest and Lung Exam Chest and lung exam reveals -quiet, even and easy respiratory effort with no use of accessory muscles. Inspection Chest Wall - Normal. Back - normal.  Cardiovascular Cardiovascular examination reveals -normal heart sounds, regular rate and rhythm with no murmurs.  Abdomen Inspection Skin - Scar - no surgical scars. Hernias - Inguinal hernia - Left - Reducible. Palpation/Percussion Normal exam - Soft, Non Tender, No Rebound tenderness, No Rigidity (guarding) and No hepatosplenomegaly. Auscultation Normal exam - Bowel sounds normal.  Neurologic Neurologic evaluation reveals -alert and oriented x 3 with no impairment of recent or remote memory. Mental Status-Normal.  Musculoskeletal Normal Exam - Left-Upper Extremity Strength Normal and Lower Extremity Strength Normal. Normal Exam - Right-Upper Extremity Strength Normal, Lower Extremity Weakness.    Assessment & Plan [name](Aniesha Haughn MD; 04/03/2015 9:43 AM) LEFT INGUINAL HERNIA (K40.90) Impression: 29 year old male with a left likely indirect inguinal hernia  1. The patient will like to proceed to the operating room for laparoscopic left inguinal hernia repair.  2.I discussed with the patient the risks and benefits of the procedure to include but not limited to: Infection, bleeding, damage to surrounding structures, possible need for further surgery, possible nerve pain, and possible recurrence. The patient was understanding and wishes to proceed.

## 2015-04-21 NOTE — Anesthesia Preprocedure Evaluation (Addendum)
Anesthesia Evaluation  Patient identified by MRN, date of birth, ID band Patient awake    Reviewed: Allergy & Precautions, H&P , NPO status , Patient's Chart, lab work & pertinent test results  History of Anesthesia Complications (+) PONV and history of anesthetic complications  Airway Mallampati: I  TM Distance: >3 FB Neck ROM: full    Dental no notable dental hx.    Pulmonary neg pulmonary ROS, former smoker,    Pulmonary exam normal breath sounds clear to auscultation       Cardiovascular negative cardio ROS Normal cardiovascular exam Rhythm:regular Rate:Normal     Neuro/Psych negative neurological ROS     GI/Hepatic negative GI ROS, Neg liver ROS,   Endo/Other  negative endocrine ROS  Renal/GU negative Renal ROS     Musculoskeletal   Abdominal   Peds  Hematology negative hematology ROS (+)   Anesthesia Other Findings   Reproductive/Obstetrics negative OB ROS                            Anesthesia Physical Anesthesia Plan  ASA: II  Anesthesia Plan: General   Post-op Pain Management:    Induction: Intravenous  Airway Management Planned: Oral ETT  Additional Equipment:   Intra-op Plan:   Post-operative Plan: Extubation in OR  Informed Consent: I have reviewed the patients History and Physical, chart, labs and discussed the procedure including the risks, benefits and alternatives for the proposed anesthesia with the patient or authorized representative who has indicated his/her understanding and acceptance.   Dental Advisory Given  Plan Discussed with: Anesthesiologist, CRNA and Surgeon  Anesthesia Plan Comments: (GA with oral ETT, scop patch, zofran, decadron)        Anesthesia Quick Evaluation

## 2015-04-24 ENCOUNTER — Encounter (HOSPITAL_COMMUNITY): Payer: Self-pay | Admitting: General Surgery

## 2015-06-07 ENCOUNTER — Other Ambulatory Visit: Payer: Self-pay | Admitting: Occupational Medicine

## 2015-06-07 ENCOUNTER — Ambulatory Visit
Admission: RE | Admit: 2015-06-07 | Discharge: 2015-06-07 | Disposition: A | Discharge status: Home / Self Care | Payer: No Typology Code available for payment source | Source: Ambulatory Visit | Attending: Occupational Medicine | Admitting: Occupational Medicine

## 2015-06-07 DIAGNOSIS — Z021 Encounter for pre-employment examination: Secondary | ICD-10-CM

## 2016-01-25 IMAGING — CR DG CHEST 1V
1 series · 1 of 1 positions shown · non-contrast
Comparison: None in PACs

CLINICAL DATA: Pre-employment evaluation, discontinued smoking 2
years ago.

EXAM:
CHEST 1 VIEW

[w chest pa]
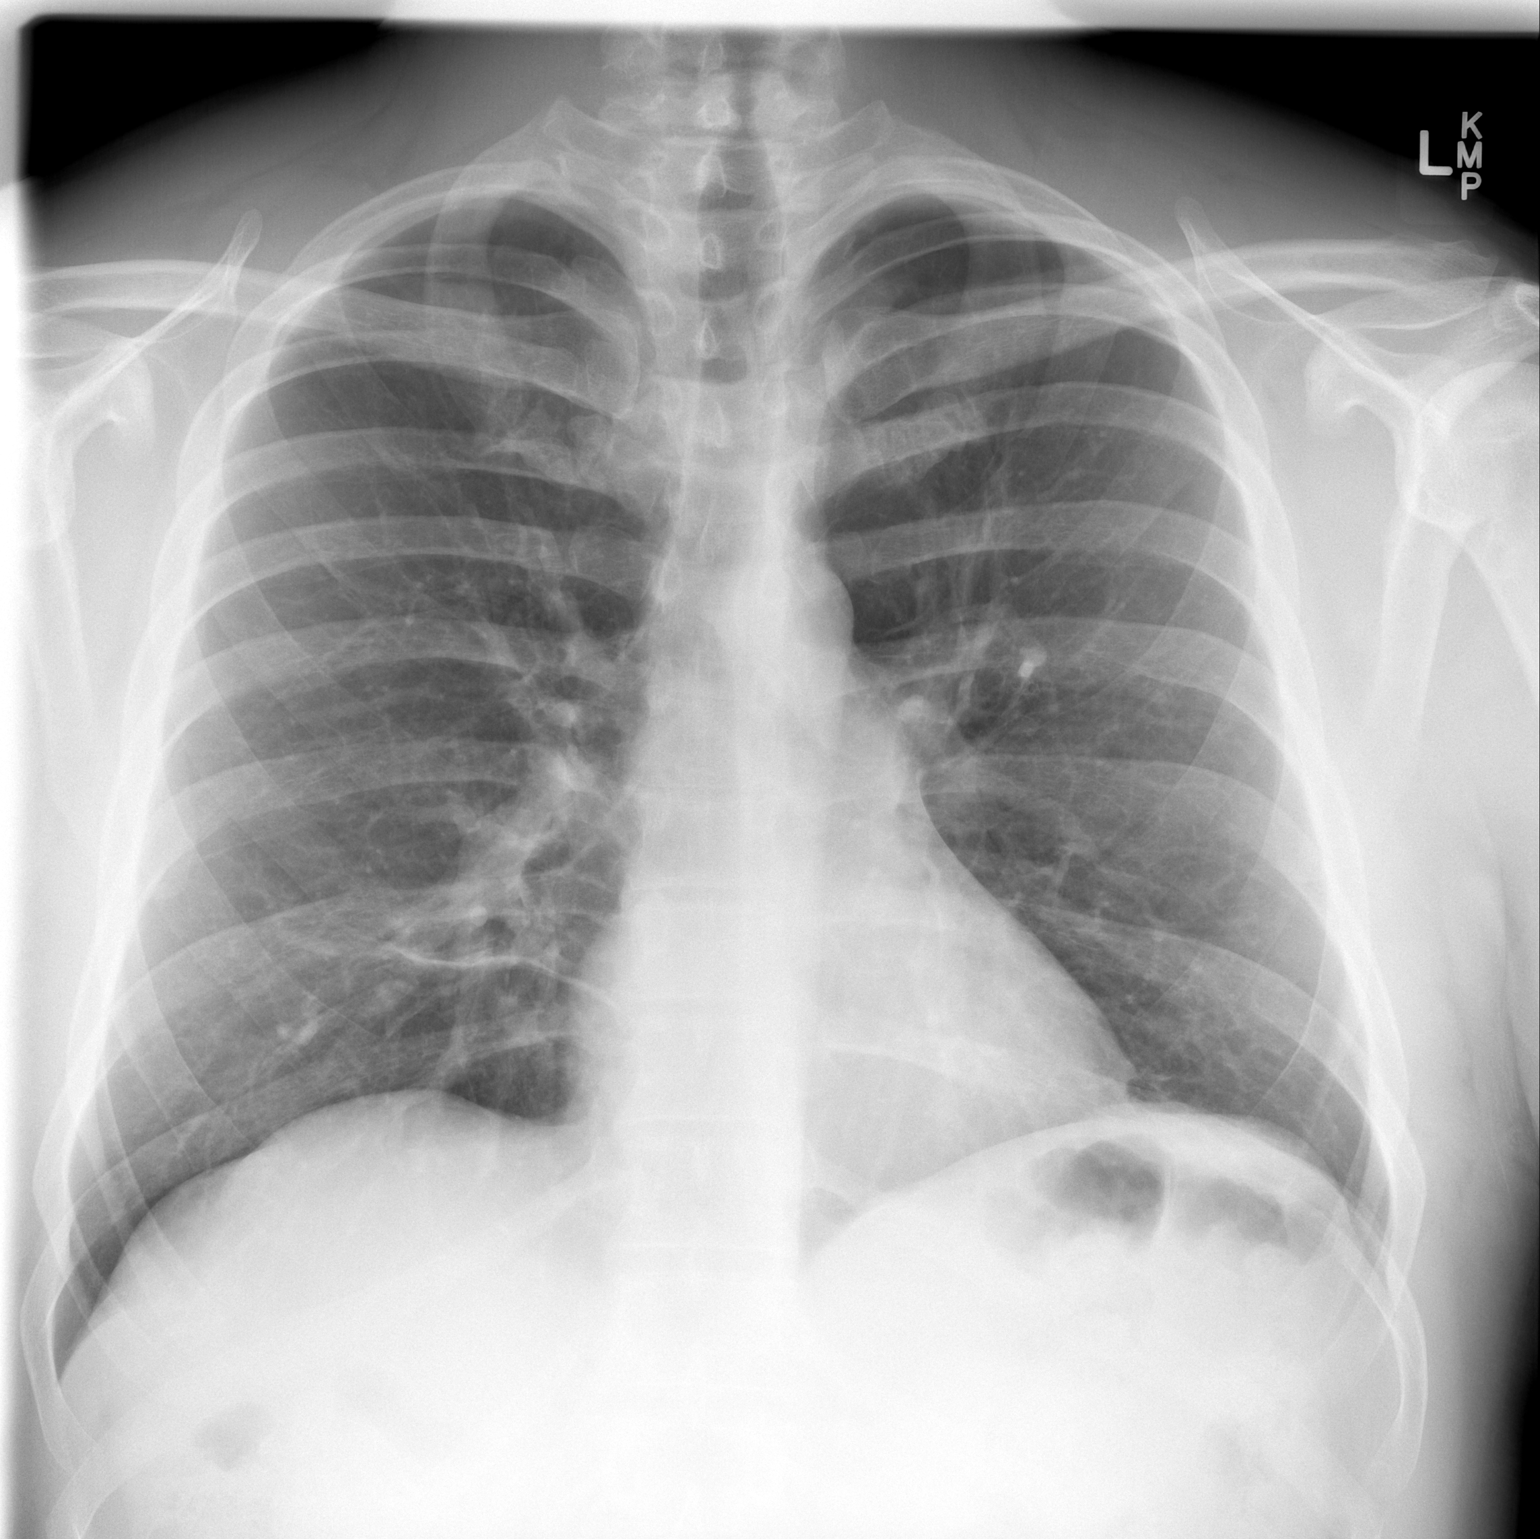

[1 of 1 positions shown; findings below may reference images not displayed]

FINDINGS: The lungs are adequately inflated. There is linear density in the
right infrahilar region. There is no pleural effusion or
pneumothorax. No pulmonary parenchymal masses or infiltrates are
observed. The heart and pulmonary vascularity are normal. The
mediastinum is normal in width. The bony thorax is unremarkable.
IMPRESSION: Linear density in the low right infrahilar region likely reflects
scarring or less likely subsegmental atelectasis. Otherwise there is
no active cardiopulmonary disease.

## 2020-03-09 MED FILL — oxyCODONE HCL 5 MG TABS: 5 | 7 days supply | Qty: 40 | Fill #0

## 2020-03-09 MED FILL — METHOCARBAMOL 500 MG TABS: 500 | 10 days supply | Qty: 40 | Fill #0

## 2020-03-09 MED FILL — ONDANSETRON HCL 4 MG TABLET: 4 | 15 days supply | Qty: 30 | Fill #0

## 2020-03-09 MED FILL — CEPHALEXIN 500 MG CAPSULE: 500 | 3 days supply | Qty: 12 | Fill #0

## 2020-03-31 ENCOUNTER — Other Ambulatory Visit (HOSPITAL_COMMUNITY): Payer: Self-pay | Admitting: Urology

## 2020-03-31 MED FILL — DIAZEPAM 10 MG TABS: 10 | 1 days supply | Qty: 1 | Fill #0

## 2020-07-31 ENCOUNTER — Other Ambulatory Visit (HOSPITAL_COMMUNITY): Payer: Self-pay | Admitting: Physician Assistant

## 2020-07-31 MED FILL — ADDERALL XR 15 MG CAP SA: 15 | 30 days supply | Qty: 30 | Fill #0

## 2020-08-18 ENCOUNTER — Other Ambulatory Visit (HOSPITAL_COMMUNITY): Payer: Self-pay | Admitting: Physician Assistant

## 2020-08-18 MED FILL — VYVANSE 50 MG CAPSULE: 50 | 30 days supply | Qty: 30 | Fill #0

## 2020-09-04 ENCOUNTER — Other Ambulatory Visit (HOSPITAL_COMMUNITY): Payer: Self-pay | Admitting: Physician Assistant

## 2020-09-15 ENCOUNTER — Other Ambulatory Visit (HOSPITAL_COMMUNITY): Payer: Self-pay | Admitting: Physician Assistant

## 2020-09-15 MED FILL — ADHANSIA XR 55 MG CP24: 55 | 30 days supply | Qty: 30 | Fill #0

## 2020-10-05 ENCOUNTER — Other Ambulatory Visit (HOSPITAL_COMMUNITY): Payer: Self-pay

## 2020-10-05 MED ORDER — AMPHETAMINE-DEXTROAMPHET ER 25 MG PO CP24
25.0000 mg | ORAL_CAPSULE | Freq: Two times a day (BID) | ORAL | 0 refills | Status: DC
  Filled 2020-10-05: qty 60, 30d supply, fill #0

## 2020-10-06 ENCOUNTER — Other Ambulatory Visit (HOSPITAL_COMMUNITY): Payer: Self-pay

## 2020-10-06 MED ORDER — ADDERALL XR 20 MG PO CP24
20.0000 mg | ORAL_CAPSULE | Freq: Every day | ORAL | 0 refills | Status: DC
  Filled 2020-10-06: qty 30, 30d supply, fill #0

## 2020-10-06 MED ORDER — ADDERALL XR 30 MG PO CP24
30.0000 mg | ORAL_CAPSULE | Freq: Every day | ORAL | 0 refills | Status: DC
  Filled 2020-10-06: qty 30, 30d supply, fill #0

## 2020-10-09 ENCOUNTER — Other Ambulatory Visit (HOSPITAL_COMMUNITY): Payer: Self-pay

## 2020-10-10 ENCOUNTER — Other Ambulatory Visit (HOSPITAL_COMMUNITY): Payer: Self-pay

## 2020-10-12 ENCOUNTER — Other Ambulatory Visit (HOSPITAL_COMMUNITY): Payer: Self-pay

## 2020-11-22 ENCOUNTER — Other Ambulatory Visit (HOSPITAL_COMMUNITY): Payer: Self-pay

## 2020-11-22 MED ORDER — AMPHETAMINE-DEXTROAMPHET ER 30 MG PO CP24
30.0000 mg | ORAL_CAPSULE | Freq: Every day | ORAL | 0 refills | Status: DC
  Filled 2020-11-22: qty 30, 30d supply, fill #0

## 2020-11-22 MED ORDER — AMPHETAMINE-DEXTROAMPHET ER 20 MG PO CP24
20.0000 mg | ORAL_CAPSULE | Freq: Every day | ORAL | 0 refills | Status: DC
  Filled 2020-11-22: qty 30, 30d supply, fill #0

## 2020-11-23 ENCOUNTER — Other Ambulatory Visit (HOSPITAL_COMMUNITY): Payer: Self-pay

## 2020-11-27 ENCOUNTER — Other Ambulatory Visit (HOSPITAL_COMMUNITY): Payer: Self-pay

## 2021-01-02 ENCOUNTER — Other Ambulatory Visit (HOSPITAL_COMMUNITY): Payer: Self-pay

## 2021-01-02 MED ORDER — AMPHETAMINE-DEXTROAMPHET ER 30 MG PO CP24
30.0000 mg | ORAL_CAPSULE | Freq: Every day | ORAL | 0 refills | Status: DC
  Filled 2021-01-02: qty 30, 30d supply, fill #0

## 2021-01-02 MED ORDER — AMPHETAMINE-DEXTROAMPHET ER 20 MG PO CP24
20.0000 mg | ORAL_CAPSULE | Freq: Every day | ORAL | 0 refills | Status: DC
  Filled 2021-01-02: qty 30, 30d supply, fill #0

## 2021-02-06 ENCOUNTER — Other Ambulatory Visit (HOSPITAL_COMMUNITY): Payer: Self-pay

## 2021-02-06 MED ORDER — AMPHETAMINE-DEXTROAMPHET ER 20 MG PO CP24
20.0000 mg | ORAL_CAPSULE | Freq: Every day | ORAL | 0 refills | Status: DC
  Filled 2021-02-06: qty 30, 30d supply, fill #0

## 2021-02-06 MED ORDER — AMPHETAMINE-DEXTROAMPHET ER 30 MG PO CP24
30.0000 mg | ORAL_CAPSULE | Freq: Every day | ORAL | 0 refills | Status: DC
  Filled 2021-02-06: qty 30, 30d supply, fill #0

## 2021-03-14 ENCOUNTER — Other Ambulatory Visit (HOSPITAL_COMMUNITY): Payer: Self-pay

## 2021-03-14 MED ORDER — AMPHETAMINE-DEXTROAMPHET ER 20 MG PO CP24
20.0000 mg | ORAL_CAPSULE | Freq: Every day | ORAL | 0 refills | Status: DC
  Filled 2021-03-14: qty 30, 30d supply, fill #0

## 2021-03-14 MED ORDER — AMPHETAMINE-DEXTROAMPHET ER 30 MG PO CP24
30.0000 mg | ORAL_CAPSULE | Freq: Every day | ORAL | 0 refills | Status: DC
  Filled 2021-03-14: qty 30, 30d supply, fill #0

## 2021-04-16 ENCOUNTER — Other Ambulatory Visit (HOSPITAL_COMMUNITY): Payer: Self-pay

## 2021-04-16 MED ORDER — AMPHETAMINE-DEXTROAMPHET ER 30 MG PO CP24
30.0000 mg | ORAL_CAPSULE | Freq: Every day | ORAL | 0 refills | Status: DC
  Filled 2021-04-16: qty 30, 30d supply, fill #0

## 2021-04-16 MED ORDER — AMPHETAMINE-DEXTROAMPHET ER 20 MG PO CP24
20.0000 mg | ORAL_CAPSULE | Freq: Every day | ORAL | 0 refills | Status: DC
  Filled 2021-04-16: qty 30, 30d supply, fill #0

## 2021-05-21 ENCOUNTER — Other Ambulatory Visit (HOSPITAL_COMMUNITY): Payer: Self-pay

## 2021-05-21 MED ORDER — AMPHETAMINE-DEXTROAMPHET ER 20 MG PO CP24
20.0000 mg | ORAL_CAPSULE | Freq: Every day | ORAL | 0 refills | Status: DC
  Filled 2021-05-21: qty 30, 30d supply, fill #0

## 2021-05-21 MED ORDER — AMPHETAMINE-DEXTROAMPHET ER 30 MG PO CP24
30.0000 mg | ORAL_CAPSULE | Freq: Every day | ORAL | 0 refills | Status: DC
  Filled 2021-05-21: qty 30, 30d supply, fill #0

## 2021-06-22 ENCOUNTER — Other Ambulatory Visit (HOSPITAL_COMMUNITY): Payer: Self-pay

## 2021-06-22 MED ORDER — AMPHETAMINE-DEXTROAMPHET ER 20 MG PO CP24
20.0000 mg | ORAL_CAPSULE | Freq: Every day | ORAL | 0 refills | Status: DC
  Filled 2021-06-22: qty 30, 30d supply, fill #0

## 2021-06-22 MED ORDER — AMPHETAMINE-DEXTROAMPHET ER 30 MG PO CP24
30.0000 mg | ORAL_CAPSULE | Freq: Every day | ORAL | 0 refills | Status: DC
  Filled 2021-06-22: qty 30, 30d supply, fill #0

## 2021-07-30 ENCOUNTER — Other Ambulatory Visit (HOSPITAL_COMMUNITY): Payer: Self-pay

## 2021-07-30 MED ORDER — AMPHETAMINE-DEXTROAMPHET ER 20 MG PO CP24
20.0000 mg | ORAL_CAPSULE | Freq: Every day | ORAL | 0 refills | Status: DC
  Filled 2021-07-30: qty 30, 30d supply, fill #0

## 2021-07-30 MED ORDER — AMPHETAMINE-DEXTROAMPHET ER 30 MG PO CP24
30.0000 mg | ORAL_CAPSULE | Freq: Every day | ORAL | 0 refills | Status: DC
  Filled 2021-07-30: qty 30, 30d supply, fill #0

## 2021-08-23 ENCOUNTER — Other Ambulatory Visit (HOSPITAL_COMMUNITY): Payer: Self-pay

## 2021-08-23 MED ORDER — AMPHETAMINE-DEXTROAMPHET ER 30 MG PO CP24
30.0000 mg | ORAL_CAPSULE | Freq: Every day | ORAL | 0 refills | Status: DC
  Filled 2021-08-23 – 2021-09-13 (×2): qty 30, 30d supply, fill #0

## 2021-08-23 MED ORDER — AMPHETAMINE-DEXTROAMPHET ER 20 MG PO CP24
20.0000 mg | ORAL_CAPSULE | Freq: Every day | ORAL | 0 refills | Status: DC
  Filled 2021-09-04: qty 30, 30d supply, fill #0

## 2021-09-04 ENCOUNTER — Other Ambulatory Visit (HOSPITAL_COMMUNITY): Payer: Self-pay

## 2021-09-12 ENCOUNTER — Other Ambulatory Visit (HOSPITAL_COMMUNITY): Payer: Self-pay

## 2021-09-13 ENCOUNTER — Other Ambulatory Visit (HOSPITAL_COMMUNITY): Payer: Self-pay

## 2021-10-17 ENCOUNTER — Other Ambulatory Visit (HOSPITAL_COMMUNITY): Payer: Self-pay

## 2021-10-17 MED ORDER — AMPHETAMINE-DEXTROAMPHET ER 30 MG PO CP24
30.0000 mg | ORAL_CAPSULE | Freq: Every day | ORAL | 0 refills | Status: DC
  Filled 2021-10-17: qty 30, 30d supply, fill #0

## 2021-10-17 MED ORDER — AMPHETAMINE-DEXTROAMPHET ER 20 MG PO CP24
20.0000 mg | ORAL_CAPSULE | Freq: Every day | ORAL | 0 refills | Status: DC
  Filled 2021-10-17: qty 30, 30d supply, fill #0

## 2021-11-26 ENCOUNTER — Other Ambulatory Visit (HOSPITAL_COMMUNITY): Payer: Self-pay

## 2021-11-26 MED ORDER — AMPHETAMINE-DEXTROAMPHET ER 30 MG PO CP24
30.0000 mg | ORAL_CAPSULE | Freq: Every day | ORAL | 0 refills | Status: DC
  Filled 2021-11-26: qty 30, 30d supply, fill #0

## 2021-11-26 MED ORDER — AMPHETAMINE-DEXTROAMPHET ER 20 MG PO CP24
20.0000 mg | ORAL_CAPSULE | Freq: Every day | ORAL | 0 refills | Status: DC
  Filled 2021-11-26: qty 30, 30d supply, fill #0

## 2022-01-28 ENCOUNTER — Other Ambulatory Visit (HOSPITAL_COMMUNITY): Payer: Self-pay

## 2022-01-28 MED ORDER — AMPHETAMINE-DEXTROAMPHET ER 20 MG PO CP24
20.0000 mg | ORAL_CAPSULE | Freq: Every day | ORAL | 0 refills | Status: DC
  Filled 2022-01-28: qty 30, 30d supply, fill #0

## 2022-01-28 MED ORDER — AMPHETAMINE-DEXTROAMPHET ER 30 MG PO CP24
30.0000 mg | ORAL_CAPSULE | Freq: Every day | ORAL | 0 refills | Status: DC
  Filled 2022-01-28: qty 30, 30d supply, fill #0

## 2022-02-28 ENCOUNTER — Other Ambulatory Visit (HOSPITAL_COMMUNITY): Payer: Self-pay

## 2022-02-28 MED ORDER — AMPHETAMINE-DEXTROAMPHET ER 20 MG PO CP24
20.0000 mg | ORAL_CAPSULE | Freq: Every day | ORAL | 0 refills | Status: DC
  Filled 2022-02-28: qty 30, 30d supply, fill #0

## 2022-02-28 MED ORDER — AMPHETAMINE-DEXTROAMPHET ER 30 MG PO CP24
30.0000 mg | ORAL_CAPSULE | Freq: Every day | ORAL | 0 refills | Status: DC
  Filled 2022-02-28: qty 30, 30d supply, fill #0

## 2022-03-13 ENCOUNTER — Other Ambulatory Visit (HOSPITAL_COMMUNITY): Payer: Self-pay

## 2022-03-19 ENCOUNTER — Other Ambulatory Visit (HOSPITAL_COMMUNITY): Payer: Self-pay

## 2022-06-19 ENCOUNTER — Other Ambulatory Visit (HOSPITAL_COMMUNITY): Payer: Self-pay

## 2022-06-19 MED ORDER — AMPHETAMINE-DEXTROAMPHET ER 30 MG PO CP24
30.0000 mg | ORAL_CAPSULE | Freq: Every day | ORAL | 0 refills | Status: DC
  Filled 2022-06-19: qty 30, 30d supply, fill #0

## 2022-06-19 MED ORDER — AMPHETAMINE-DEXTROAMPHET ER 20 MG PO CP24
20.0000 mg | ORAL_CAPSULE | Freq: Every day | ORAL | 0 refills | Status: DC
  Filled 2022-06-19: qty 30, 30d supply, fill #0

## 2022-08-05 ENCOUNTER — Other Ambulatory Visit (HOSPITAL_COMMUNITY): Payer: Self-pay

## 2022-08-05 MED ORDER — AMPHETAMINE-DEXTROAMPHET ER 30 MG PO CP24
30.0000 mg | ORAL_CAPSULE | Freq: Every day | ORAL | 0 refills | Status: DC
  Filled 2022-08-05: qty 30, 30d supply, fill #0

## 2022-08-05 MED ORDER — AMPHETAMINE-DEXTROAMPHET ER 20 MG PO CP24
20.0000 mg | ORAL_CAPSULE | Freq: Every day | ORAL | 0 refills | Status: DC
  Filled 2022-08-05: qty 30, 30d supply, fill #0

## 2022-09-27 ENCOUNTER — Other Ambulatory Visit (HOSPITAL_COMMUNITY): Payer: Self-pay

## 2022-09-27 MED ORDER — AMPHETAMINE-DEXTROAMPHET ER 20 MG PO CP24
20.0000 mg | ORAL_CAPSULE | Freq: Every day | ORAL | 0 refills | Status: DC
  Filled 2022-09-27: qty 30, 30d supply, fill #0

## 2022-09-27 MED ORDER — AMPHETAMINE-DEXTROAMPHETAMINE 20 MG PO TABS
20.0000 mg | ORAL_TABLET | Freq: Every day | ORAL | 0 refills | Status: DC
  Filled 2022-09-27: qty 30, 30d supply, fill #0

## 2022-09-27 MED ORDER — AMPHETAMINE-DEXTROAMPHET ER 30 MG PO CP24
30.0000 mg | ORAL_CAPSULE | Freq: Every day | ORAL | 0 refills | Status: DC
  Filled 2022-09-27: qty 30, 30d supply, fill #0

## 2022-09-30 ENCOUNTER — Other Ambulatory Visit (HOSPITAL_COMMUNITY): Payer: Self-pay

## 2022-10-01 ENCOUNTER — Other Ambulatory Visit (HOSPITAL_COMMUNITY): Payer: Self-pay

## 2022-11-07 ENCOUNTER — Other Ambulatory Visit (HOSPITAL_COMMUNITY): Payer: Self-pay

## 2023-03-06 ENCOUNTER — Other Ambulatory Visit (HOSPITAL_COMMUNITY): Payer: Self-pay

## 2023-03-06 MED ORDER — AMPHETAMINE-DEXTROAMPHETAMINE 20 MG PO TABS
10.0000 mg | ORAL_TABLET | Freq: Every day | ORAL | 0 refills | Status: DC
Start: 2023-03-06 — End: 2023-05-06
  Filled 2023-03-06: qty 30, 30d supply, fill #0

## 2023-03-06 MED ORDER — AMPHETAMINE-DEXTROAMPHET ER 30 MG PO CP24
30.0000 mg | ORAL_CAPSULE | Freq: Every day | ORAL | 0 refills | Status: DC
Start: 2023-03-06 — End: 2023-05-06
  Filled 2023-03-06: qty 30, 30d supply, fill #0

## 2023-05-06 ENCOUNTER — Other Ambulatory Visit (HOSPITAL_COMMUNITY): Payer: Self-pay

## 2023-05-06 MED ORDER — AMPHETAMINE-DEXTROAMPHET ER 30 MG PO CP24
30.0000 mg | ORAL_CAPSULE | Freq: Every morning | ORAL | 0 refills | Status: DC
Start: 2023-05-06 — End: 2023-06-26
  Filled 2023-05-06: qty 30, 30d supply, fill #0

## 2023-05-06 MED ORDER — AMPHETAMINE-DEXTROAMPHETAMINE 20 MG PO TABS
20.0000 mg | ORAL_TABLET | Freq: Every day | ORAL | 0 refills | Status: DC
Start: 2023-05-06 — End: 2023-06-26
  Filled 2023-05-06: qty 30, 30d supply, fill #0

## 2023-06-26 ENCOUNTER — Other Ambulatory Visit (HOSPITAL_COMMUNITY): Payer: Self-pay

## 2023-06-26 MED ORDER — AMPHETAMINE-DEXTROAMPHET ER 30 MG PO CP24
30.0000 mg | ORAL_CAPSULE | Freq: Every morning | ORAL | 0 refills | Status: DC
Start: 2023-06-26 — End: 2023-08-04
  Filled 2023-06-26: qty 30, 30d supply, fill #0

## 2023-06-26 MED ORDER — AMPHETAMINE-DEXTROAMPHETAMINE 20 MG PO TABS
20.0000 mg | ORAL_TABLET | Freq: Every day | ORAL | 0 refills | Status: DC
Start: 2023-06-26 — End: 2023-08-04
  Filled 2023-06-26: qty 30, 30d supply, fill #0

## 2023-08-04 ENCOUNTER — Other Ambulatory Visit (HOSPITAL_COMMUNITY): Payer: Self-pay

## 2023-08-04 MED ORDER — AMPHETAMINE-DEXTROAMPHET ER 30 MG PO CP24
30.0000 mg | ORAL_CAPSULE | Freq: Every morning | ORAL | 0 refills | Status: DC
Start: 2023-08-04 — End: 2023-09-02
  Filled 2023-08-04: qty 30, 30d supply, fill #0

## 2023-08-04 MED ORDER — AMPHETAMINE-DEXTROAMPHETAMINE 20 MG PO TABS
20.0000 mg | ORAL_TABLET | Freq: Every day | ORAL | 0 refills | Status: DC
Start: 2023-08-04 — End: 2023-09-02
  Filled 2023-08-04: qty 30, 30d supply, fill #0

## 2023-09-02 ENCOUNTER — Other Ambulatory Visit (HOSPITAL_COMMUNITY): Payer: Self-pay

## 2023-09-02 MED ORDER — AMPHETAMINE-DEXTROAMPHETAMINE 20 MG PO TABS
20.0000 mg | ORAL_TABLET | Freq: Every day | ORAL | 0 refills | Status: DC
Start: 2023-09-02 — End: 2023-10-15
  Filled 2023-09-02: qty 30, 30d supply, fill #0

## 2023-09-02 MED ORDER — AMPHETAMINE-DEXTROAMPHET ER 30 MG PO CP24
30.0000 mg | ORAL_CAPSULE | Freq: Every morning | ORAL | 0 refills | Status: DC
Start: 2023-09-02 — End: 2023-10-15
  Filled 2023-09-02: qty 30, 30d supply, fill #0

## 2023-10-15 ENCOUNTER — Other Ambulatory Visit (HOSPITAL_COMMUNITY): Payer: Self-pay

## 2023-10-15 MED ORDER — AMPHETAMINE-DEXTROAMPHETAMINE 20 MG PO TABS
20.0000 mg | ORAL_TABLET | Freq: Every day | ORAL | 0 refills | Status: DC
Start: 2023-10-15 — End: 2023-11-20
  Filled 2023-10-15: qty 30, 30d supply, fill #0

## 2023-10-15 MED ORDER — AMPHETAMINE-DEXTROAMPHET ER 30 MG PO CP24
30.0000 mg | ORAL_CAPSULE | Freq: Every morning | ORAL | 0 refills | Status: DC
  Filled 2023-10-15: qty 30, 30d supply, fill #0

## 2023-11-20 ENCOUNTER — Other Ambulatory Visit (HOSPITAL_COMMUNITY): Payer: Self-pay

## 2023-11-20 MED ORDER — AMPHETAMINE-DEXTROAMPHETAMINE 20 MG PO TABS
20.0000 mg | ORAL_TABLET | Freq: Every day | ORAL | 0 refills | Status: DC
  Filled 2023-11-20: qty 30, 30d supply, fill #0

## 2023-11-20 MED ORDER — AMPHETAMINE-DEXTROAMPHET ER 30 MG PO CP24
30.0000 mg | ORAL_CAPSULE | Freq: Every morning | ORAL | 0 refills | Status: DC
  Filled 2023-11-20: qty 30, 30d supply, fill #0

## 2023-11-25 ENCOUNTER — Ambulatory Visit (INDEPENDENT_AMBULATORY_CARE_PROVIDER_SITE_OTHER): Admitting: Sleep Medicine

## 2023-11-25 ENCOUNTER — Ambulatory Visit: Admitting: General Practice

## 2023-11-25 ENCOUNTER — Encounter: Payer: Self-pay | Admitting: General Practice

## 2023-11-25 ENCOUNTER — Encounter: Payer: Self-pay | Admitting: Sleep Medicine

## 2023-11-25 VITALS — BP 136/84 | HR 82 | Temp 98.2°F | Ht 75.0 in | Wt 250.0 lb

## 2023-11-25 VITALS — BP 110/80 | HR 84 | Temp 98.3°F | Ht 76.0 in | Wt 251.4 lb

## 2023-11-25 DIAGNOSIS — R0683 Snoring: Secondary | ICD-10-CM | POA: Insufficient documentation

## 2023-11-25 DIAGNOSIS — Z87891 Personal history of nicotine dependence: Secondary | ICD-10-CM | POA: Diagnosis not present

## 2023-11-25 DIAGNOSIS — F909 Attention-deficit hyperactivity disorder, unspecified type: Secondary | ICD-10-CM

## 2023-11-25 DIAGNOSIS — Z0000 Encounter for general adult medical examination without abnormal findings: Secondary | ICD-10-CM | POA: Diagnosis not present

## 2023-11-25 DIAGNOSIS — Z7689 Persons encountering health services in other specified circumstances: Secondary | ICD-10-CM | POA: Insufficient documentation

## 2023-11-25 DIAGNOSIS — G4733 Obstructive sleep apnea (adult) (pediatric): Secondary | ICD-10-CM | POA: Diagnosis not present

## 2023-11-25 NOTE — Patient Instructions (Addendum)
 Please have your labs faxed to me.   You will either be contacted via phone regarding your referral to pulmonology, or you may receive a letter on your MyChart portal from our referral team with instructions for scheduling an appointment. Please let us  know if you have not been contacted by anyone within two weeks.   Follow up in one year physical.  It was a pleasure to meet you today! Please don't hesitate to contact me with any questions. Welcome to [facility]!

## 2023-11-25 NOTE — Patient Instructions (Signed)
 [name]

## 2023-11-25 NOTE — Assessment & Plan Note (Signed)
 EMR reviewed briefly.

## 2023-11-25 NOTE — Progress Notes (Signed)
 Name:[name] MRN: [id number] DOB: 09/28/1985   CHIEF COMPLAINT:  EXCESSIVE DAYTIME SLEEPINESS   HISTORY OF PRESENT ILLNESS:  [name] is a 38 y.o. w/ a h/o ADHD who presents for c/o loud snoring which has been present for several years. Reports nocturnal awakenings due to nocturia, however does not have difficulty falling back to sleep. Reports a 10-15 lb weight gain over the last few yeas. Denies morning headaches, RLS symptoms, dream enactment, cataplexy, hypnagogic or hypnapompic hallucinations. Reports a family history of sleep apnea. Denies drowsy driving. Drinks 32 oz of coffee daily, occasional alcohol use, denies tobacco or illicit drug use.   Bedtime 10 pm Sleep onset 10 mins Rise time 6-7 am   EPWORTH SLEEP SCORE 5    11/25/2023    2:00 PM  Results of the Epworth flowsheet  Sitting and reading 2  Watching TV 1  Sitting, inactive in a public place (e.g. a theatre or a meeting) 0  As a passenger in a car for an hour without a break 1  Lying down to rest in the afternoon when circumstances permit 1  Sitting and talking to someone 0  Sitting quietly after a lunch without alcohol 0  In a car, while stopped for a few minutes in traffic 0  Total score 5    PAST MEDICAL HISTORY :   has a past medical history of History of chicken pox and PONV (postoperative nausea and vomiting).  has a past surgical history that includes Back surgery; Shoulder surgery; Ankle surgery; Knee arthroscopy w/ ACL reconstruction and patella graft; Inguinal hernia repair (Left, 04/21/2015); and Insertion of mesh (Left, 04/21/2015). Prior to Admission medications   Medication Sig Start Date End Date Taking? Authorizing Provider  acetaminophen  (TYLENOL ) 325 MG tablet Take 650 mg by mouth every 6 (six) hours as needed.   Yes [provider]  amphetamine -dextroamphetamine  (ADDERALL  XR) 30 MG 24 hr capsule Take 1 capsule (30 mg total) by mouth every morning. 11/20/23  Yes    amphetamine -dextroamphetamine  (ADDERALL ) 20 MG tablet Take 1/2 - 1 tablet (10-20mg ) by mouth daily. Patient not taking: Reported on 11/25/2023 09/27/22      No Known Allergies  FAMILY HISTORY:  family history includes Cancer in his paternal grandmother; Diabetes in his mother and paternal grandmother; Heart attack in his mother; Heart disease in his mother; Hyperlipidemia in his mother and paternal grandmother; Hypertension in his mother and paternal grandmother; Kidney disease in his mother; Miscarriages / Stillbirths in his paternal grandmother; Stroke in his paternal grandfather and paternal grandmother. SOCIAL HISTORY:  reports that he has quit smoking. His smoking use included cigarettes. He has never used smokeless tobacco. He reports current alcohol use. He reports that he does not use drugs.   Review of Systems:  Gen:  Denies  fever, sweats, chills weight loss  HEENT: Denies blurred vision, double vision, ear pain, eye pain, hearing loss, nose bleeds, sore throat Cardiac:  No dizziness, chest pain or heaviness, chest tightness,edema, No JVD Resp:   No cough, -sputum production, -shortness of breath,-wheezing, -hemoptysis,  Gi: Denies swallowing difficulty, stomach pain, nausea or vomiting, diarrhea, constipation, bowel incontinence Gu:  Denies bladder incontinence, burning urine Ext:   Denies Joint pain, stiffness or swelling Skin: Denies  skin rash, easy bruising or bleeding or hives Endoc:  Denies polyuria, polydipsia , polyphagia or weight change Psych:   Denies depression, insomnia or hallucinations  Other:  All other systems negative  VITAL SIGNS: BP  110/80 (BP Location: Right Arm, Patient Position: Sitting, Cuff Size: Large)   Pulse 84   Temp 98.3 F (36.8 C) (Oral)   Ht 6\' 4"  (1.93 m)   Wt 251 lb 6.4 oz (114 kg)   SpO2 95%   BMI 30.60 kg/m    Physical Examination:   General Appearance: No distress  EYES PERRLA, EOM intact.   NECK Supple, No JVD Pulmonary: normal  breath sounds, No wheezing.  CardiovascularNormal S1,S2.  No m/r/g.   Abdomen: Benign, Soft, non-tender. Skin:   warm, no rashes, no ecchymosis  Extremities: normal, no cyanosis, clubbing. Neuro:without focal findings,  speech normal  PSYCHIATRIC: Mood, affect within normal limits.   ASSESSMENT AND PLAN  OSA I suspect that OSA is likely present due to clinical presentation. Discussed the consequences of untreated sleep apnea. Advised not to drive drowsy for safety of patient and others. Will complete further evaluation with a home sleep study and follow up to review results.    ADHD Stable, on current management. Following with PCP.    MEDICATION ADJUSTMENTS/LABS AND TESTS ORDERED: Recommend Sleep Study   Patient  satisfied with Plan of action and management. All questions answered  Follow up to review HST results and treatment plan.   I spent a total of 30 minutes reviewing chart data, face-to-face evaluation with the patient, counseling and coordination of care as detailed above.    Hykeem Ojeda, M.D.  Sleep Medicine [facility]

## 2023-11-25 NOTE — Progress Notes (Signed)
 New Patient Office Visit  Subjective    Patient ID: [name], male    DOB: Jul 28, 1985  Age: 38 y.o. MRN: [id number]  CC:  Chief Complaint  Patient presents with   New Patient (Initial Visit)   Referral    Needs sleep study done    HPI [name] is a 38 y.o. male presents to establish care.   [facility]physical[facility]labs[facility]; many years ago. 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   Snoring: Had a sleep study in 2016 and was told that he did not have sleep apnea. He has tried Flonase and allergy medication with no relief. He has not followed up with ENT. At home, goes to bed around 10 pm and wakes up around 6. He does work long shifts as a IT sales professional. There are days when he does wake up feeling tired but not all the time. He has not fallen asleep while driving and years ago he was told that he woke up gasping but that was before the sleep study. He has been sleeping on the couch due to the loud snoring. He denies any chest pain, shortness of breath, difficulty breathing. 4  ADHD: diagnosed a few years ago. Followed by Jonette Nestle attention specialist. Currently managed on Adderral XR 30 mg once daily. There are times that he does take Adderral IR 20 mg 2-3 days a week. No concerns today.   Had EKG, pulmonary test, hearing, vision, labs for his work once a year in January.   -Tetanus: Completed in 2021  Diet: Fair diet.  Exercise: Regular exercise.  Eye exam: Completed several years ago.  Dental exam: Completes semi-annually    PSA: had it done in January.   Outpatient Encounter Medications as of 11/25/2023  Medication Sig   acetaminophen  (TYLENOL ) 325 MG tablet Take 650 mg by mouth every 6 (six) hours as needed.   amphetamine -dextroamphetamine  (ADDERALL  XR) 30 MG 24 hr capsule Take 1 capsule (30 mg total) by mouth every morning.   amphetamine -dextroamphetamine  (ADDERALL ) 20 MG tablet Take 1/2 - 1 tablet (10-20mg ) by mouth daily.   [DISCONTINUED]  Amphet-Dextroamphet 3-Bead ER 50 MG CP24 TAKE 1 CAPSULE BY MOUTH ONCE DAILY   [DISCONTINUED] amphetamine -dextroamphetamine  (ADDERALL  XR) 15 MG 24 hr capsule TAKE 1 CAPSULE BY MOUTH ONCE DAILY.   [DISCONTINUED] amphetamine -dextroamphetamine  (ADDERALL  XR) 20 MG 24 hr capsule Take 1 capsule (20 mg total) by mouth daily in the afternoon   [DISCONTINUED] amphetamine -dextroamphetamine  (ADDERALL  XR) 20 MG 24 hr capsule Take 1 capsule (20 mg total) by mouth daily in the afternoon.   [DISCONTINUED] amphetamine -dextroamphetamine  (ADDERALL  XR) 20 MG 24 hr capsule Take 1 capsule (20 mg total) by mouth daily in the afternoon.   [DISCONTINUED] amphetamine -dextroamphetamine  (ADDERALL  XR) 25 MG 24 hr capsule Take 1 capsule by mouth 2 times a day   [DISCONTINUED] amphetamine -dextroamphetamine  (ADDERALL  XR) 30 MG 24 hr capsule Take 1 capsule (30 mg total) by mouth daily.   [DISCONTINUED] amphetamine -dextroamphetamine  (ADDERALL  XR) 30 MG 24 hr capsule Take 1 capsule (30 mg total) by mouth daily.   [DISCONTINUED] amphetamine -dextroamphetamine  (ADDERALL  XR) 30 MG 24 hr capsule Take 1 capsule (30 mg total) by mouth daily.   [DISCONTINUED] amphetamine -dextroamphetamine  (ADDERALL ) 20 MG tablet Take 1 tablet (20 mg total) by mouth daily.   [DISCONTINUED] lisdexamfetamine (VYVANSE) 50 MG capsule TAKE 1 CAPSULE BY MOUTH DAILY   [DISCONTINUED] Methylphenidate HCl ER 55 MG CP24 TAKE 1 CAPSULE BY MOUTH ONCE DAILY   [DISCONTINUED] oxyCODONE -acetaminophen  (ROXICET) 5-325 MG tablet Take 1-2  tablets by mouth every 4 (four) hours as needed.   No facility-administered encounter medications on file as of 11/25/2023.    Past Medical History:  Diagnosis Date   History of chicken pox    PONV (postoperative nausea and vomiting)     Past Surgical History:  Procedure Laterality Date   ANKLE SURGERY     BACK SURGERY     INGUINAL HERNIA REPAIR Left 04/21/2015   Procedure: LAPAROSCOPIC INGUINAL HERNIA;  Surgeon: Shela Derby, MD;   Location: [facility] OR;  Service: General;  Laterality: Left;   INSERTION OF MESH Left 04/21/2015   Procedure: INSERTION OF MESH;  Surgeon: Shela Derby, MD;  Location: [facility] OR;  Service: General;  Laterality: Left;   KNEE ARTHROSCOPY W/ ACL RECONSTRUCTION AND PATELLA GRAFT     left   SHOULDER SURGERY      Family History  Problem Relation Age of Onset   Kidney disease Mother    Hypertension Mother    Hyperlipidemia Mother    Heart disease Mother    Diabetes Mother    Heart attack Mother    Stroke Paternal Grandmother    Miscarriages / Stillbirths Paternal Grandmother    Hypertension Paternal Grandmother    Hyperlipidemia Paternal Grandmother    Diabetes Paternal Grandmother    Cancer Paternal Grandmother    Stroke Paternal Grandfather     Social History   Socioeconomic History   Marital status: Married    Spouse name: Not on file   Number of children: Not on file   Years of education: Not on file   Highest education level: Not on file  Occupational History   Not on file  Tobacco Use   Smoking status: Former    Types: Cigarettes   Smokeless tobacco: Never  Substance and Sexual Activity   Alcohol use: Yes    Comment: occ   Drug use: No   Sexual activity: Yes    Birth control/protection: None  Other Topics Concern   Not on file  Social History Narrative   Not on file   Social Drivers of Health   Financial Resource Strain: Not on file  Food Insecurity: Not on file  Transportation Needs: Not on file  Physical Activity: Not on file  Stress: Not on file  Social Connections: Not on file  Intimate Partner Violence: Not on file    Review of Systems  Constitutional:  Negative for chills, fever, malaise/fatigue and weight loss.  HENT:  Negative for congestion, ear discharge, ear pain, hearing loss, nosebleeds, sinus pain, sore throat and tinnitus.   Eyes:  Negative for blurred vision, double vision, pain, discharge and redness.  Respiratory:  Negative for cough,  shortness of breath, wheezing and stridor.   Cardiovascular:  Negative for chest pain, palpitations and leg swelling.  Gastrointestinal:  Negative for abdominal pain, constipation, diarrhea, heartburn, nausea and vomiting.  Genitourinary:  Negative for dysuria, frequency and urgency.  Musculoskeletal:  Negative for myalgias.  Skin:  Negative for rash.  Neurological:  Negative for dizziness, tingling, seizures, weakness and headaches.  Endo/Heme/Allergies:  Negative for polydipsia.  Psychiatric/Behavioral:  Negative for depression, substance abuse and suicidal ideas. The patient is not nervous/anxious.         Objective    BP 136/84 (BP Location: Left Arm, Patient Position: Sitting, Cuff Size: Normal)   Pulse 82   Temp 98.2 F (36.8 C) (Oral)   Ht 6\' 3"  (1.905 m)   Wt 250 lb (113.4 kg)   SpO2 96%  BMI 31.25 kg/m   Physical Exam Vitals and nursing note reviewed.  Constitutional:      Appearance: Normal appearance.  HENT:     Head: Normocephalic and atraumatic.     Right Ear: Tympanic membrane, ear canal and external ear normal.     Left Ear: Tympanic membrane, ear canal and external ear normal.     Nose: Nose normal.     Mouth/Throat:     Mouth: Mucous membranes are moist.     Pharynx: Oropharynx is clear.  Eyes:     Conjunctiva/sclera: Conjunctivae normal.     Pupils: Pupils are equal, round, and reactive to light.  Cardiovascular:     Rate and Rhythm: Normal rate and regular rhythm.     Pulses: Normal pulses.     Heart sounds: Normal heart sounds.  Pulmonary:     Effort: Pulmonary effort is normal.     Breath sounds: Normal breath sounds.  Abdominal:     General: Abdomen is flat. Bowel sounds are normal.     Palpations: Abdomen is soft.  Musculoskeletal:        General: Normal range of motion.     Cervical back: Normal range of motion.  Skin:    General: Skin is warm and dry.     Capillary Refill: Capillary refill takes less than 2 seconds.  Neurological:      General: No focal deficit present.     Mental Status: He is alert and oriented to person, place, and time. Mental status is at baseline.  Psychiatric:        Mood and Affect: Mood normal.        Behavior: Behavior normal.        Thought Content: Thought content normal.        Judgment: Judgment normal.         Assessment & Plan:  Snoring Assessment & Plan: Chronic and uncontrolled.   Discussed at length for options.  At this time, he would like to have another sleep study done. Referral placed.  Consider ENT referral if needed.  No red flags.  Orders: -     Pulmonary Visit  Establishing care with new doctor, encounter for Assessment & Plan: EMR reviewed briefly.    Encounter for general adult medical examination w/o abnormal findings Assessment & Plan: Immunizations UTD.  Discussed the importance of a healthy diet and regular exercise in order for weight loss, and to reduce the risk of further co-morbidity.  Exam stable. He will have the labs faxed.   Follow up in 1 year for repeat physical.      Return in about 1 year (around 11/24/2024) for physical.   Jolanda Nation, NP

## 2023-11-25 NOTE — Assessment & Plan Note (Signed)
 Immunizations UTD.  Discussed the importance of a healthy diet and regular exercise in order for weight loss, and to reduce the risk of further co-morbidity.  Exam stable. He will have the labs faxed.   Follow up in 1 year for repeat physical.

## 2023-11-25 NOTE — Assessment & Plan Note (Signed)
 Chronic and uncontrolled.   Discussed at length for options.  At this time, he would like to have another sleep study done. Referral placed.  Consider ENT referral if needed.  No red flags.

## 2023-11-26 ENCOUNTER — Telehealth: Payer: Self-pay

## 2023-11-26 NOTE — Telephone Encounter (Signed)
 Copied from CRM [id number]. Topic: Clinical - Order For Equipment >> Nov 26, 2023  1:32 PM Eveleen Hinds B wrote: Reason for CRM:  Patient calling regarding his order for equipment. States he called [facility] to place an order and they need signed order and notes, and they have not received. Patient would like to "get the ball rolling."

## 2023-12-02 NOTE — Telephone Encounter (Signed)
 Bridgette Campus with [facility] called me back and stated [facility] spoke with the patient on 11/26/23. Bridgette Campus was going to reach out to the patient again today

## 2023-12-02 NOTE — Telephone Encounter (Signed)
 Order was sent to [facility] on 11/25/23 and it doesn't look like the patient has registered with them. I have left a message with Bridgette Campus with [facility] to check on this order

## 2023-12-14 ENCOUNTER — Encounter

## 2023-12-14 DIAGNOSIS — G4733 Obstructive sleep apnea (adult) (pediatric): Secondary | ICD-10-CM

## 2023-12-23 DIAGNOSIS — G4733 Obstructive sleep apnea (adult) (pediatric): Secondary | ICD-10-CM | POA: Diagnosis not present

## 2023-12-24 ENCOUNTER — Ambulatory Visit: Payer: Self-pay

## 2023-12-24 DIAGNOSIS — G4733 Obstructive sleep apnea (adult) (pediatric): Secondary | ICD-10-CM

## 2023-12-24 NOTE — Telephone Encounter (Signed)
 Order placed, DME to be determined.

## 2023-12-30 ENCOUNTER — Other Ambulatory Visit (HOSPITAL_COMMUNITY): Payer: Self-pay

## 2023-12-30 MED ORDER — AMPHETAMINE-DEXTROAMPHET ER 30 MG PO CP24
30.0000 mg | ORAL_CAPSULE | Freq: Every morning | ORAL | 0 refills | Status: DC
  Filled 2023-12-30: qty 30, 30d supply, fill #0

## 2023-12-30 MED ORDER — AMPHETAMINE-DEXTROAMPHETAMINE 20 MG PO TABS
20.0000 mg | ORAL_TABLET | Freq: Every day | ORAL | 0 refills | Status: DC
  Filled 2023-12-30: qty 30, 30d supply, fill #0

## 2023-12-31 ENCOUNTER — Other Ambulatory Visit: Payer: Self-pay

## 2024-01-14 ENCOUNTER — Telehealth: Payer: Self-pay | Admitting: Sleep Medicine

## 2024-01-14 NOTE — Telephone Encounter (Signed)
 Patient needs CPAP compliance between 02/10/2024 to 04/09/2024.

## 2024-01-19 ENCOUNTER — Other Ambulatory Visit (HOSPITAL_COMMUNITY): Payer: Self-pay

## 2024-01-19 MED ORDER — AMPHETAMINE-DEXTROAMPHETAMINE 20 MG PO TABS
20.0000 mg | ORAL_TABLET | Freq: Every day | ORAL | 0 refills | Status: DC
  Filled 2024-01-21: qty 30, 30d supply, fill #0

## 2024-01-19 MED ORDER — AMPHETAMINE-DEXTROAMPHET ER 30 MG PO CP24
30.0000 mg | ORAL_CAPSULE | Freq: Every morning | ORAL | 0 refills | Status: DC
  Filled 2024-01-19 – 2024-01-21 (×2): qty 30, 30d supply, fill #0

## 2024-01-21 ENCOUNTER — Other Ambulatory Visit (HOSPITAL_COMMUNITY): Payer: Self-pay

## 2024-01-23 NOTE — Telephone Encounter (Signed)
 Patient needs CPAP compliance between 02/10/2024 to 04/09/2024.

## 2024-02-05 ENCOUNTER — Other Ambulatory Visit (HOSPITAL_BASED_OUTPATIENT_CLINIC_OR_DEPARTMENT_OTHER): Payer: Self-pay | Admitting: Family Medicine

## 2024-02-05 DIAGNOSIS — Z8249 Family history of ischemic heart disease and other diseases of the circulatory system: Secondary | ICD-10-CM

## 2024-02-05 LAB — CBC AND DIFFERENTIAL
HCT: 54 — AB (ref 41–53)
Hemoglobin: 18.3 — AB (ref 13.5–17.5)
Platelets: 168 K/uL (ref 150–400)
WBC: 5.5

## 2024-02-05 LAB — LIPID PANEL
Cholesterol: 240 — AB (ref 0–200)
HDL: 48 (ref 35–70)
LDL Cholesterol: 134
Triglycerides: 146 (ref 40–160)

## 2024-02-05 LAB — COMPREHENSIVE METABOLIC PANEL WITH GFR
Albumin: 4.6 (ref 3.5–5.0)
Calcium: 9.6 (ref 8.7–10.7)
EGFR: 77
Globulin: 2.2

## 2024-02-05 LAB — HEPATIC FUNCTION PANEL
ALT: 27 U/L (ref 10–40)
AST: 28 (ref 14–40)
Alkaline Phosphatase: 61 (ref 25–125)

## 2024-02-05 LAB — BASIC METABOLIC PANEL WITH GFR
BUN: 20 (ref 4–21)
CO2: 22 (ref 13–22)
Chloride: 100 (ref 99–108)
Creatinine: 1.2 (ref 0.6–1.3)
Glucose: 99
Potassium: 5 meq/L (ref 3.5–5.1)
Sodium: 139 (ref 137–147)

## 2024-02-05 LAB — CBC: RBC: 5.76 — AB (ref 3.87–5.11)

## 2024-02-05 LAB — PSA: PSA: 0.4

## 2024-02-12 ENCOUNTER — Encounter: Payer: Self-pay | Admitting: Sleep Medicine

## 2024-02-12 ENCOUNTER — Ambulatory Visit: Admitting: Sleep Medicine

## 2024-02-12 VITALS — BP 136/80 | HR 88 | Temp 97.8°F | Ht 74.0 in | Wt 249.2 lb

## 2024-02-12 DIAGNOSIS — Z87891 Personal history of nicotine dependence: Secondary | ICD-10-CM

## 2024-02-12 DIAGNOSIS — G4733 Obstructive sleep apnea (adult) (pediatric): Secondary | ICD-10-CM | POA: Diagnosis not present

## 2024-02-12 NOTE — Progress Notes (Signed)
 Name:[name] MRN: [id number] DOB: 11/21/1985   CHIEF COMPLAINT:  CPAP F/U   HISTORY OF PRESENT ILLNESS:  [name] is a 38 y.o. w/ a h/o OSA, ADHD and obesity who presents for CPAP F/U visit. Reports using CPAP therapy almost every night, which is confirmed by compliance data. He is currently using the Airtouch N30i nasal mask, which is comfortable. Reports nasal congestion. Reports more refreshed upon awakening with CPAP therapy.    EPWORTH SLEEP SCORE    11/25/2023    2:00 PM  Results of the Epworth flowsheet  Sitting and reading 2  Watching TV 1  Sitting, inactive in a public place (e.g. a theatre or a meeting) 0  As a passenger in a car for an hour without a break 1  Lying down to rest in the afternoon when circumstances permit 1  Sitting and talking to someone 0  Sitting quietly after a lunch without alcohol 0  In a car, while stopped for a few minutes in traffic 0  Total score 5    PAST MEDICAL HISTORY :   has a past medical history of History of chicken pox and PONV (postoperative nausea and vomiting).  has a past surgical history that includes Back surgery; Shoulder surgery; Ankle surgery; Knee arthroscopy w/ ACL reconstruction and patella graft; Inguinal hernia repair (Left, 04/21/2015); and Insertion of mesh (Left, 04/21/2015). Prior to Admission medications   Medication Sig Start Date End Date Taking? Authorizing Provider  acetaminophen  (TYLENOL ) 325 MG tablet Take 650 mg by mouth every 6 (six) hours as needed.    [provider]  amphetamine -dextroamphetamine  (ADDERALL  XR) 30 MG 24 hr capsule Take 1 capsule (30 mg total) by mouth every morning. 01/21/24     amphetamine -dextroamphetamine  (ADDERALL ) 20 MG tablet Take 1/2 - 1 tablet (10-20mg ) by mouth daily. Patient not taking: Reported on 11/25/2023 09/27/22     amphetamine -dextroamphetamine  (ADDERALL ) 20 MG tablet Take 1 tablet (20 mg total) by mouth daily in the afternoon. 01/21/24      No  Known Allergies  FAMILY HISTORY:  family history includes Cancer in his paternal grandmother; Diabetes in his mother and paternal grandmother; Heart attack in his mother; Heart disease in his mother; Hyperlipidemia in his mother and paternal grandmother; Hypertension in his mother and paternal grandmother; Kidney disease in his mother; Miscarriages / Stillbirths in his paternal grandmother; Stroke in his paternal grandfather and paternal grandmother. SOCIAL HISTORY:  reports that he has quit smoking. His smoking use included cigarettes. He has never used smokeless tobacco. He reports current alcohol use. He reports that he does not use drugs.   Review of Systems:  Gen:  Denies  fever, sweats, chills weight loss  HEENT: Denies blurred vision, double vision, ear pain, eye pain, hearing loss, nose bleeds, sore throat Cardiac:  No dizziness, chest pain or heaviness, chest tightness,edema, No JVD Resp:   No cough, -sputum production, -shortness of breath,-wheezing, -hemoptysis,  Gi: Denies swallowing difficulty, stomach pain, nausea or vomiting, diarrhea, constipation, bowel incontinence Gu:  Denies bladder incontinence, burning urine Ext:   Denies Joint pain, stiffness or swelling Skin: Denies  skin rash, easy bruising or bleeding or hives Endoc:  Denies polyuria, polydipsia , polyphagia or weight change Psych:   Denies depression, insomnia or hallucinations  Other:  All other systems negative  VITAL SIGNS: BP 136/80   Pulse 88   Temp 97.8 F (36.6 C) (Oral)   Ht 6' 2 (1.88 m)   Wt  249 lb 3.2 oz (113 kg)   SpO2 96%   BMI 32.00 kg/m    Physical Examination:   General Appearance: No distress  EYES PERRLA, EOM intact.   NECK Supple, No JVD Pulmonary: normal breath sounds, No wheezing.  CardiovascularNormal S1,S2.  No m/r/g.   Abdomen: Benign, Soft, non-tender. Skin:   warm, no rashes, no ecchymosis  Extremities: normal, no cyanosis, clubbing. Neuro:without focal findings,   speech normal  PSYCHIATRIC: Mood, affect within normal limits.   ASSESSMENT AND PLAN  OSA Patient is using and benefiting from CPAP therapy. Discussed the consequences of untreated sleep apnea. Advised not to drive drowsy for safety of patient and others. Will follow up in 6 months.     Patient  satisfied with Plan of action and management. All questions answered  I spent a total of 34 minutes reviewing chart data, face-to-face evaluation with the patient, counseling and coordination of care as detailed above.    Euline Kimbler, M.D.  Sleep Medicine [facility]

## 2024-02-12 NOTE — Patient Instructions (Addendum)

## 2024-02-17 ENCOUNTER — Ambulatory Visit (HOSPITAL_BASED_OUTPATIENT_CLINIC_OR_DEPARTMENT_OTHER)
Admission: RE | Admit: 2024-02-17 | Discharge: 2024-02-17 | Disposition: A | Discharge status: Home / Self Care | Payer: Self-pay | Source: Ambulatory Visit | Attending: Family Medicine | Admitting: Family Medicine

## 2024-02-17 DIAGNOSIS — Z8249 Family history of ischemic heart disease and other diseases of the circulatory system: Secondary | ICD-10-CM

## 2024-02-24 ENCOUNTER — Encounter: Payer: Self-pay | Admitting: General Practice

## 2024-02-26 ENCOUNTER — Other Ambulatory Visit (HOSPITAL_COMMUNITY): Payer: Self-pay

## 2024-02-26 MED ORDER — AMPHETAMINE-DEXTROAMPHETAMINE 20 MG PO TABS
20.0000 mg | ORAL_TABLET | Freq: Every day | ORAL | 0 refills | Status: AC
  Filled 2024-02-26: qty 30, 30d supply, fill #0

## 2024-02-26 MED ORDER — AMPHETAMINE-DEXTROAMPHET ER 30 MG PO CP24
30.0000 mg | ORAL_CAPSULE | Freq: Every morning | ORAL | 0 refills | Status: DC
  Filled 2024-02-26: qty 30, 30d supply, fill #0

## 2024-03-01 ENCOUNTER — Encounter: Payer: Self-pay | Admitting: General Practice

## 2024-03-01 ENCOUNTER — Ambulatory Visit: Admitting: General Practice

## 2024-03-01 VITALS — BP 120/78 | HR 84 | Temp 98.7°F | Ht 74.0 in | Wt 252.2 lb

## 2024-03-01 DIAGNOSIS — E66811 Obesity, class 1: Secondary | ICD-10-CM | POA: Diagnosis not present

## 2024-03-01 DIAGNOSIS — E7800 Pure hypercholesterolemia, unspecified: Secondary | ICD-10-CM

## 2024-03-01 DIAGNOSIS — R7989 Other specified abnormal findings of blood chemistry: Secondary | ICD-10-CM

## 2024-03-01 DIAGNOSIS — Z8342 Family history of familial hypercholesterolemia: Secondary | ICD-10-CM

## 2024-03-01 DIAGNOSIS — Z6832 Body mass index (BMI) 32.0-32.9, adult: Secondary | ICD-10-CM

## 2024-03-01 DIAGNOSIS — R931 Abnormal findings on diagnostic imaging of heart and coronary circulation: Secondary | ICD-10-CM | POA: Diagnosis not present

## 2024-03-01 DIAGNOSIS — E6609 Other obesity due to excess calories: Secondary | ICD-10-CM

## 2024-03-01 DIAGNOSIS — R41840 Attention and concentration deficit: Secondary | ICD-10-CM | POA: Insufficient documentation

## 2024-03-01 LAB — POCT GLYCOSYLATED HEMOGLOBIN (HGB A1C): Hemoglobin A1C: 5.4 % (ref 4.0–5.6)

## 2024-03-01 NOTE — Patient Instructions (Addendum)
 Schedule lab only appointment for 3 months. Please come fasting.   Please eat a low fat, low cholesterol diet.   Follow up in 6 months in the office.  It was a pleasure to see you today!

## 2024-03-01 NOTE — Progress Notes (Signed)
 Established Patient Office Visit  Subjective   Patient ID: [name], male    DOB: Mar 15, 1986  Age: 38 y.o. MRN: [id number]  Chief Complaint  Patient presents with   Follow-up    From Cardiac CT    HPI  Discussed the use of AI scribe software for clinical note transcription with the patient, who gave verbal consent to proceed.  History of Present Illness [name] is a 38 year old male who presents for follow-up after a cardiac CT scan showing elevated coronary calcium score.  He underwent a cardiac CT scan as part of a work requirement for the [employer], which revealed a coronary calcium score of 154, considered borderline high. His cholesterol levels have been consistently borderline, with the most recent level at 165 mg/dL.   His liver function tests were slightly elevated, which he attributes to recent travel to [place]. His diet has been poor since returning from the trip, although he usually maintains a healthy diet and meal preps at home.  He has no known history of diabetes, and his glucose levels have been normal in the past. He underwent a sleep study previously due to snoring issues.  He is unsure if his hemoglobin A1c has been checked recently, but he believes his blood sugar levels typically run in the 80s or 90s.  In terms of family history, his mother has a history of heart problems related to a mitral valve issue, but there is no family history of early heart attacks or significant coronary artery disease. 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   Patient Active Problem List   Diagnosis Date Noted   Difficulty concentrating 03/01/2024   Attention deficit 03/01/2024   Encounter for general adult medical examination w/o abnormal findings 11/25/2023   Snoring 11/25/2023   Establishing care with new doctor, encounter for 11/25/2023   Past Medical History:  Diagnosis Date   History  of chicken pox    PONV (postoperative nausea and vomiting)    Past Surgical History:  Procedure Laterality Date   ANKLE SURGERY     BACK SURGERY     INGUINAL HERNIA REPAIR Left 04/21/2015   Procedure: LAPAROSCOPIC INGUINAL HERNIA;  Surgeon: Lynda Leos, MD;  Location: [facility] OR;  Service: General;  Laterality: Left;   INSERTION OF MESH Left 04/21/2015   Procedure: INSERTION OF MESH;  Surgeon: Lynda Leos, MD;  Location: [facility] OR;  Service: General;  Laterality: Left;   KNEE ARTHROSCOPY W/ ACL RECONSTRUCTION AND PATELLA GRAFT     left   SHOULDER SURGERY     No Known Allergies       03/01/2024    9:17 AM 11/25/2023   12:12 PM  Depression screen PHQ 2/9  Decreased Interest 0 0  Down, Depressed, Hopeless 0 0  PHQ - 2 Score 0 0  Altered sleeping 0 1  Tired, decreased energy 0 1  Change in appetite 0 0  Feeling bad or failure about yourself  0 0  Trouble concentrating 1 1  Moving slowly or fidgety/restless 0 1  Suicidal thoughts 0 0  PHQ-9 Score 1 4  Difficult doing work/chores Not difficult at all Not difficult at all       03/01/2024    9:17 AM 11/25/2023   12:12 PM  GAD 7 : Generalized Anxiety Score  Nervous, Anxious, on Edge 0 0  Control/stop worrying 0 0  Worry too much - different  things 0 0  Trouble relaxing 0 0  Restless 0 0  Easily annoyed or irritable 0 0  Afraid - awful might happen 0 0  Total GAD 7 Score 0 0  Anxiety Difficulty Not difficult at all Not difficult at all      Review of Systems  Constitutional:  Negative for chills and fever.  Respiratory:  Negative for shortness of breath.   Cardiovascular:  Negative for chest pain, palpitations, orthopnea and leg swelling.  Gastrointestinal:  Negative for abdominal pain, constipation, diarrhea, heartburn, nausea and vomiting.  Genitourinary:  Negative for dysuria, frequency and urgency.  Neurological:  Negative for dizziness and headaches.  Endo/Heme/Allergies:  Negative for polydipsia.   Psychiatric/Behavioral:  Negative for depression and suicidal ideas. The patient is not nervous/anxious.       Objective:     BP 120/78   Pulse 84   Temp 98.7 F (37.1 C) (Oral)   Ht 6' 2 (1.88 m)   Wt 252 lb 3.2 oz (114.4 kg)   SpO2 96%   BMI 32.38 kg/m  BP Readings from Last 3 Encounters:  03/01/24 120/78  02/12/24 136/80  11/25/23 110/80   Wt Readings from Last 3 Encounters:  03/01/24 252 lb 3.2 oz (114.4 kg)  02/12/24 249 lb 3.2 oz (113 kg)  11/25/23 251 lb 6.4 oz (114 kg)      Physical Exam Vitals and nursing note reviewed.  Constitutional:      Appearance: Normal appearance.  Cardiovascular:     Rate and Rhythm: Normal rate and regular rhythm.     Pulses: Normal pulses.     Heart sounds: Normal heart sounds.  Pulmonary:     Effort: Pulmonary effort is normal.     Breath sounds: Normal breath sounds.  Neurological:     Mental Status: He is alert and oriented to person, place, and time.  Psychiatric:        Mood and Affect: Mood normal.        Behavior: Behavior normal.        Thought Content: Thought content normal.        Judgment: Judgment normal.      Results for orders placed or performed in visit on 03/01/24  POCT glycosylated hemoglobin (Hb A1C)  Result Value Ref Range   Hemoglobin A1C 5.4 4.0 - 5.6 %   HbA1c POC (<> result, manual entry)     HbA1c, POC (prediabetic range)     HbA1c, POC (controlled diabetic range)         The ASCVD Risk score (Arnett DK, et al., 2019) failed to calculate for the following reasons:   The 2019 ASCVD risk score is only valid for ages 22 to 68    Assessment & Plan:  Elevated LDL cholesterol level -     POCT glycosylated hemoglobin (Hb A1C) -     Lipid panel; Future  Abnormal LFTs -     POCT glycosylated hemoglobin (Hb A1C) -     Hepatic function panel; Future  Class 1 obesity due to excess calories with serious comorbidity and body mass index (BMI) of 32.0 to 32.9 in adult -     POCT glycosylated  hemoglobin (Hb A1C)  Agatston coronary artery calcium score between 100 and 199  Family history of high cholesterol -     Lipoprotein A (LPA); Future    Assessment and Plan Assessment & Plan Hypercholesterolemia with elevated coronary artery calcium score Coronary calcium score of 154 indicates borderline high risk  for coronary artery disease. He prefers dietary management over statins. - Monitor cholesterol with dietary changes for three months. - Order lipid panel in December to reassess. - Consider Cardiology referral.   Elevated liver function tests Elevated liver function tests possibly due to recent travel, alcohol, and acetaminophen  use. - hemoglobin A1c 5.4 today. - Order hepatic panel. - Advise minimizing alcohol, tylenol  use and fatty foods.   Return in about 3 months (around 05/31/2024) for lab only.    Carrol Aurora, NP

## 2024-04-01 ENCOUNTER — Encounter: Payer: Self-pay | Admitting: General Practice

## 2024-05-10 ENCOUNTER — Other Ambulatory Visit (HOSPITAL_COMMUNITY): Payer: Self-pay

## 2024-05-10 MED ORDER — AMPHETAMINE-DEXTROAMPHET ER 30 MG PO CP24
30.0000 mg | ORAL_CAPSULE | Freq: Every morning | ORAL | 0 refills | Status: DC
  Filled 2024-05-10: qty 30, 30d supply, fill #0

## 2024-06-01 ENCOUNTER — Other Ambulatory Visit

## 2024-07-09 ENCOUNTER — Other Ambulatory Visit (HOSPITAL_COMMUNITY): Payer: Self-pay

## 2024-07-09 MED ORDER — ZEPBOUND 2.5 MG/0.5ML ~~LOC~~ SOAJ: 2.5000 mg | SUBCUTANEOUS | 0 refills | Status: AC

## 2024-07-09 MED ORDER — ZEPBOUND 2.5 MG/0.5ML ~~LOC~~ SOAJ
2.5000 mg | SUBCUTANEOUS | 0 refills | Status: AC
  Filled 2024-07-09 – 2024-07-14 (×2): qty 2, 28d supply, fill #0

## 2024-07-14 ENCOUNTER — Other Ambulatory Visit (HOSPITAL_COMMUNITY): Payer: Self-pay

## 2024-07-14 MED ORDER — AMPHETAMINE-DEXTROAMPHET ER 30 MG PO CP24
30.0000 mg | ORAL_CAPSULE | Freq: Every morning | ORAL | 0 refills | Status: AC
  Filled 2024-07-14: qty 30, 30d supply, fill #0

## 2024-07-26 ENCOUNTER — Other Ambulatory Visit (HOSPITAL_COMMUNITY): Payer: Self-pay

## 2024-08-03 ENCOUNTER — Other Ambulatory Visit

## 2024-11-25 ENCOUNTER — Encounter: Admitting: General Practice
